# Patient Record
Sex: Female | Born: 1990 | Race: White | Hispanic: No | Smoking: Current some day smoker
Health system: Southern US, Community
[De-identification: ages and names within clinical notes are randomized; demographics above are authoritative.]

## PROBLEM LIST (undated history)

## (undated) DIAGNOSIS — R112 Nausea with vomiting, unspecified: Secondary | ICD-10-CM

## (undated) DIAGNOSIS — J189 Pneumonia, unspecified organism: Secondary | ICD-10-CM

## (undated) DIAGNOSIS — N83209 Unspecified ovarian cyst, unspecified side: Secondary | ICD-10-CM

## (undated) DIAGNOSIS — E559 Vitamin D deficiency, unspecified: Secondary | ICD-10-CM

## (undated) DIAGNOSIS — R51 Headache: Secondary | ICD-10-CM

## (undated) DIAGNOSIS — R519 Headache, unspecified: Secondary | ICD-10-CM

## (undated) DIAGNOSIS — R569 Unspecified convulsions: Secondary | ICD-10-CM

## (undated) DIAGNOSIS — K219 Gastro-esophageal reflux disease without esophagitis: Secondary | ICD-10-CM

## (undated) DIAGNOSIS — M2550 Pain in unspecified joint: Secondary | ICD-10-CM

## (undated) DIAGNOSIS — M549 Dorsalgia, unspecified: Secondary | ICD-10-CM

## (undated) DIAGNOSIS — Z8481 Family history of carrier of genetic disease: Secondary | ICD-10-CM

## (undated) DIAGNOSIS — Z8041 Family history of malignant neoplasm of ovary: Secondary | ICD-10-CM

## (undated) DIAGNOSIS — Z803 Family history of malignant neoplasm of breast: Secondary | ICD-10-CM

## (undated) DIAGNOSIS — C801 Malignant (primary) neoplasm, unspecified: Secondary | ICD-10-CM

## (undated) DIAGNOSIS — E66811 Obesity, class 1: Secondary | ICD-10-CM

## (undated) DIAGNOSIS — N879 Dysplasia of cervix uteri, unspecified: Secondary | ICD-10-CM

## (undated) DIAGNOSIS — R748 Abnormal levels of other serum enzymes: Secondary | ICD-10-CM

## (undated) DIAGNOSIS — S42009A Fracture of unspecified part of unspecified clavicle, initial encounter for closed fracture: Secondary | ICD-10-CM

## (undated) DIAGNOSIS — Z9889 Other specified postprocedural states: Secondary | ICD-10-CM

## (undated) DIAGNOSIS — E7800 Pure hypercholesterolemia, unspecified: Secondary | ICD-10-CM

## (undated) DIAGNOSIS — E669 Obesity, unspecified: Secondary | ICD-10-CM

## (undated) HISTORY — DX: Pure hypercholesterolemia, unspecified: E78.00

## (undated) HISTORY — DX: Vitamin D deficiency, unspecified: E55.9

## (undated) HISTORY — DX: Unspecified ovarian cyst, unspecified side: N83.209

## (undated) HISTORY — DX: Family history of malignant neoplasm of ovary: Z80.41

## (undated) HISTORY — PX: FRACTURE SURGERY: SHX138

## (undated) HISTORY — PX: OVARY SURGERY: SHX727

## (undated) HISTORY — DX: Pain in unspecified joint: M25.50

## (undated) HISTORY — DX: Dorsalgia, unspecified: M54.9

## (undated) HISTORY — DX: Dysplasia of cervix uteri, unspecified: N87.9

## (undated) HISTORY — PX: BUNIONECTOMY: SHX129

## (undated) HISTORY — DX: Family history of malignant neoplasm of breast: Z80.3

## (undated) HISTORY — PX: WISDOM TOOTH EXTRACTION: SHX21

## (undated) HISTORY — PX: DILATION AND CURETTAGE OF UTERUS: SHX78

## (undated) HISTORY — PX: LEEP: SHX91

## (undated) HISTORY — DX: Family history of carrier of genetic disease: Z84.81

## (undated) HISTORY — PX: HARDWARE REMOVAL: SHX979

---

## 2016-08-17 ENCOUNTER — Encounter (HOSPITAL_COMMUNITY): Payer: Self-pay | Admitting: Emergency Medicine

## 2016-08-17 ENCOUNTER — Emergency Department (HOSPITAL_COMMUNITY)
Admission: EM | Admit: 2016-08-17 | Discharge: 2016-08-17 | Disposition: A | Discharge status: Home / Self Care | Payer: BLUE CROSS/BLUE SHIELD | Attending: Emergency Medicine | Admitting: Emergency Medicine

## 2016-08-17 ENCOUNTER — Emergency Department (HOSPITAL_COMMUNITY): Payer: BLUE CROSS/BLUE SHIELD

## 2016-08-17 DIAGNOSIS — F17200 Nicotine dependence, unspecified, uncomplicated: Secondary | ICD-10-CM | POA: Diagnosis not present

## 2016-08-17 DIAGNOSIS — Y9389 Activity, other specified: Secondary | ICD-10-CM | POA: Diagnosis not present

## 2016-08-17 DIAGNOSIS — Y999 Unspecified external cause status: Secondary | ICD-10-CM | POA: Insufficient documentation

## 2016-08-17 DIAGNOSIS — W1839XA Other fall on same level, initial encounter: Secondary | ICD-10-CM | POA: Diagnosis not present

## 2016-08-17 DIAGNOSIS — S4992XA Unspecified injury of left shoulder and upper arm, initial encounter: Secondary | ICD-10-CM | POA: Diagnosis present

## 2016-08-17 DIAGNOSIS — S42022A Displaced fracture of shaft of left clavicle, initial encounter for closed fracture: Secondary | ICD-10-CM | POA: Insufficient documentation

## 2016-08-17 DIAGNOSIS — Y92331 Roller skating rink as the place of occurrence of the external cause: Secondary | ICD-10-CM | POA: Diagnosis not present

## 2016-08-17 LAB — POC URINE PREG, ED: Preg Test, Ur: NEGATIVE

## 2016-08-17 MED ORDER — ONDANSETRON HCL 4 MG PO TABS: 4.0000 mg | ORAL_TABLET | Freq: Three times a day (TID) | ORAL | 0 refills | Status: DC | PRN

## 2016-08-17 MED ORDER — OXYCODONE-ACETAMINOPHEN 5-325 MG PO TABS
2.0000 | ORAL_TABLET | Freq: Once | ORAL | Status: AC
  Administered 2016-08-17: 2 via ORAL
  Filled 2016-08-17: qty 2

## 2016-08-17 MED ORDER — OXYCODONE-ACETAMINOPHEN 5-325 MG PO TABS: 1.0000 | ORAL_TABLET | ORAL | 0 refills | Status: DC | PRN

## 2016-08-17 NOTE — ED Notes (Signed)
Patient transported to X-ray 

## 2016-08-17 NOTE — ED Notes (Signed)
ED Provider at bedside. 
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
## 2016-08-17 NOTE — ED Triage Notes (Signed)
C/o L clavicle/ shoulder pain since injuring it while she was at [place].

## 2016-08-19 ENCOUNTER — Ambulatory Visit (INDEPENDENT_AMBULATORY_CARE_PROVIDER_SITE_OTHER): Payer: BLUE CROSS/BLUE SHIELD | Admitting: Orthopaedic Surgery

## 2016-08-19 ENCOUNTER — Encounter (INDEPENDENT_AMBULATORY_CARE_PROVIDER_SITE_OTHER): Payer: Self-pay | Admitting: Orthopaedic Surgery

## 2016-08-19 ENCOUNTER — Other Ambulatory Visit (INDEPENDENT_AMBULATORY_CARE_PROVIDER_SITE_OTHER): Payer: Self-pay | Admitting: Orthopaedic Surgery

## 2016-08-19 VITALS — Ht 67.0 in | Wt 200.0 lb

## 2016-08-19 DIAGNOSIS — S42002A Fracture of unspecified part of left clavicle, initial encounter for closed fracture: Secondary | ICD-10-CM

## 2016-08-19 DIAGNOSIS — S42022A Displaced fracture of shaft of left clavicle, initial encounter for closed fracture: Secondary | ICD-10-CM | POA: Diagnosis not present

## 2016-08-19 MED ORDER — OXYCODONE-ACETAMINOPHEN 5-325 MG PO TABS: 1.0000 | ORAL_TABLET | ORAL | 0 refills | Status: DC | PRN

## 2016-08-19 MED ORDER — METHOCARBAMOL 500 MG PO TABS: 500.0000 mg | ORAL_TABLET | Freq: Four times a day (QID) | ORAL | 2 refills | Status: DC | PRN

## 2016-08-19 NOTE — Addendum Note (Signed)
Addended by: Azucena Cecil on: 08/19/2016 09:16 AM   Modules accepted: Orders
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
## 2016-08-20 ENCOUNTER — Encounter (HOSPITAL_COMMUNITY): Payer: Self-pay | Admitting: *Deleted

## 2016-08-20 NOTE — Progress Notes (Signed)
Pt denies SOB, chest pain, and being under the care of a cardiologist. Pt denies having a stress test, echo and cardiac cath. Pt denies having a chest x ray and EKG within the last year. Pt made aware to stop taking Aspirin, vitamins, fish oil and herbal medications. Do not take any NSAIDs ie: Ibuprofen, Advil, Naproxen, BC and Goody Powder or any medication containing Aspirin. FYI, pt stated that she drank a bottle of Mag Citrate today for constipation from pain medication. Pt verbalized understanding of all pre-op instructions.

## 2016-08-21 ENCOUNTER — Ambulatory Visit (HOSPITAL_COMMUNITY): Payer: BLUE CROSS/BLUE SHIELD | Admitting: Certified Registered"

## 2016-08-21 ENCOUNTER — Encounter (HOSPITAL_COMMUNITY)
Admission: RE | Disposition: A | Discharge status: Home / Self Care | Payer: Self-pay | Source: Ambulatory Visit | Attending: Orthopaedic Surgery

## 2016-08-21 ENCOUNTER — Ambulatory Visit (HOSPITAL_COMMUNITY)
Admission: RE | Admit: 2016-08-21 | Discharge: 2016-08-21 | Disposition: A | Discharge status: Home / Self Care | Payer: BLUE CROSS/BLUE SHIELD | Source: Ambulatory Visit | Attending: Orthopaedic Surgery | Admitting: Orthopaedic Surgery

## 2016-08-21 ENCOUNTER — Encounter (HOSPITAL_COMMUNITY): Payer: Self-pay | Admitting: Surgery

## 2016-08-21 ENCOUNTER — Ambulatory Visit (HOSPITAL_COMMUNITY): Payer: BLUE CROSS/BLUE SHIELD

## 2016-08-21 DIAGNOSIS — F17210 Nicotine dependence, cigarettes, uncomplicated: Secondary | ICD-10-CM | POA: Diagnosis not present

## 2016-08-21 DIAGNOSIS — K219 Gastro-esophageal reflux disease without esophagitis: Secondary | ICD-10-CM | POA: Diagnosis not present

## 2016-08-21 DIAGNOSIS — X58XXXA Exposure to other specified factors, initial encounter: Secondary | ICD-10-CM | POA: Diagnosis not present

## 2016-08-21 DIAGNOSIS — S42002A Fracture of unspecified part of left clavicle, initial encounter for closed fracture: Secondary | ICD-10-CM

## 2016-08-21 DIAGNOSIS — Z419 Encounter for procedure for purposes other than remedying health state, unspecified: Secondary | ICD-10-CM

## 2016-08-21 HISTORY — DX: Gastro-esophageal reflux disease without esophagitis: K21.9

## 2016-08-21 HISTORY — PX: ORIF CLAVICULAR FRACTURE: SHX5055

## 2016-08-21 HISTORY — DX: Unspecified convulsions: R56.9

## 2016-08-21 HISTORY — DX: Malignant (primary) neoplasm, unspecified: C80.1

## 2016-08-21 HISTORY — DX: Nausea with vomiting, unspecified: R11.2

## 2016-08-21 HISTORY — DX: Headache: R51

## 2016-08-21 HISTORY — DX: Headache, unspecified: R51.9

## 2016-08-21 HISTORY — DX: Fracture of unspecified part of unspecified clavicle, initial encounter for closed fracture: S42.009A

## 2016-08-21 HISTORY — DX: Other specified postprocedural states: Z98.890

## 2016-08-21 HISTORY — DX: Abnormal levels of other serum enzymes: R74.8

## 2016-08-21 LAB — CBC
HCT: 38.1 % (ref 36.0–46.0)
Hemoglobin: 12.9 g/dL (ref 12.0–15.0)
MCH: 31.9 pg (ref 26.0–34.0)
MCHC: 33.9 g/dL (ref 30.0–36.0)
MCV: 94.1 fL (ref 78.0–100.0)
PLATELETS: 274 10*3/uL (ref 150–400)
RBC: 4.05 MIL/uL (ref 3.87–5.11)
RDW: 12.8 % (ref 11.5–15.5)
WBC: 7.8 10*3/uL (ref 4.0–10.5)

## 2016-08-21 SURGERY — OPEN REDUCTION INTERNAL FIXATION (ORIF) CLAVICULAR FRACTURE
Anesthesia: General | Laterality: Left

## 2016-08-21 MED ORDER — MIDAZOLAM HCL 2 MG/2ML IJ SOLN
INTRAMUSCULAR | Status: AC
  Filled 2016-08-21: qty 2

## 2016-08-21 MED ORDER — LACTATED RINGERS IV SOLN: INTRAVENOUS | Status: DC

## 2016-08-21 MED ORDER — FENTANYL CITRATE (PF) 100 MCG/2ML IJ SOLN
INTRAMUSCULAR | Status: AC
  Filled 2016-08-21: qty 2

## 2016-08-21 MED ORDER — GLYCOPYRROLATE 0.2 MG/ML IJ SOLN
INTRAMUSCULAR | Status: DC | PRN
  Administered 2016-08-21: 0.6 mg via INTRAVENOUS

## 2016-08-21 MED ORDER — ONDANSETRON HCL 4 MG PO TABS: 4.0000 mg | ORAL_TABLET | Freq: Three times a day (TID) | ORAL | 0 refills | Status: DC | PRN

## 2016-08-21 MED ORDER — DEXAMETHASONE SODIUM PHOSPHATE 10 MG/ML IJ SOLN
INTRAMUSCULAR | Status: AC
  Filled 2016-08-21: qty 2

## 2016-08-21 MED ORDER — GLYCOPYRROLATE 0.2 MG/ML IV SOSY
PREFILLED_SYRINGE | INTRAVENOUS | Status: AC
  Filled 2016-08-21: qty 3

## 2016-08-21 MED ORDER — SENNOSIDES-DOCUSATE SODIUM 8.6-50 MG PO TABS: 1.0000 | ORAL_TABLET | Freq: Every evening | ORAL | 1 refills | Status: DC | PRN

## 2016-08-21 MED ORDER — PHENYLEPHRINE 40 MCG/ML (10ML) SYRINGE FOR IV PUSH (FOR BLOOD PRESSURE SUPPORT)
PREFILLED_SYRINGE | INTRAVENOUS | Status: AC
  Filled 2016-08-21: qty 20

## 2016-08-21 MED ORDER — OXYCODONE HCL 5 MG/5ML PO SOLN: 5.0000 mg | Freq: Once | ORAL | Status: AC | PRN

## 2016-08-21 MED ORDER — PROPOFOL 10 MG/ML IV BOLUS
INTRAVENOUS | Status: AC
  Filled 2016-08-21: qty 20

## 2016-08-21 MED ORDER — OXYCODONE HCL 5 MG PO TABS
5.0000 mg | ORAL_TABLET | Freq: Once | ORAL | Status: AC | PRN
  Administered 2016-08-21: 5 mg via ORAL

## 2016-08-21 MED ORDER — EPHEDRINE 5 MG/ML INJ
INTRAVENOUS | Status: AC
  Filled 2016-08-21: qty 10

## 2016-08-21 MED ORDER — 0.9 % SODIUM CHLORIDE (POUR BTL) OPTIME
TOPICAL | Status: DC | PRN
  Administered 2016-08-21: 3000 mL

## 2016-08-21 MED ORDER — ONDANSETRON HCL 4 MG/2ML IJ SOLN
INTRAMUSCULAR | Status: DC | PRN
  Administered 2016-08-21: 4 mg via INTRAVENOUS

## 2016-08-21 MED ORDER — ONDANSETRON HCL 4 MG/2ML IJ SOLN
INTRAMUSCULAR | Status: AC
  Filled 2016-08-21: qty 4

## 2016-08-21 MED ORDER — OXYCODONE HCL ER 10 MG PO T12A: 10.0000 mg | EXTENDED_RELEASE_TABLET | Freq: Two times a day (BID) | ORAL | 0 refills | Status: DC

## 2016-08-21 MED ORDER — ROCURONIUM BROMIDE 100 MG/10ML IV SOLN
INTRAVENOUS | Status: DC | PRN
  Administered 2016-08-21: 10 mg via INTRAVENOUS
  Administered 2016-08-21: 50 mg via INTRAVENOUS

## 2016-08-21 MED ORDER — LACTATED RINGERS IV SOLN
INTRAVENOUS | Status: DC | PRN
  Administered 2016-08-21 (×2): via INTRAVENOUS

## 2016-08-21 MED ORDER — PROMETHAZINE HCL 25 MG PO TABS: 25.0000 mg | ORAL_TABLET | Freq: Four times a day (QID) | ORAL | 1 refills | Status: DC | PRN

## 2016-08-21 MED ORDER — ONDANSETRON HCL 4 MG/2ML IJ SOLN: 4.0000 mg | Freq: Once | INTRAMUSCULAR | Status: DC | PRN

## 2016-08-21 MED ORDER — OXYCODONE HCL 5 MG PO TABS
ORAL_TABLET | ORAL | Status: AC
  Filled 2016-08-21: qty 1

## 2016-08-21 MED ORDER — MIDAZOLAM HCL 5 MG/5ML IJ SOLN
INTRAMUSCULAR | Status: DC | PRN
  Administered 2016-08-21: 2 mg via INTRAVENOUS

## 2016-08-21 MED ORDER — DEXAMETHASONE SODIUM PHOSPHATE 10 MG/ML IJ SOLN
INTRAMUSCULAR | Status: DC | PRN
  Administered 2016-08-21: 10 mg via INTRAVENOUS

## 2016-08-21 MED ORDER — METHOCARBAMOL 750 MG PO TABS: 750.0000 mg | ORAL_TABLET | Freq: Two times a day (BID) | ORAL | 0 refills | Status: DC | PRN

## 2016-08-21 MED ORDER — FENTANYL CITRATE (PF) 100 MCG/2ML IJ SOLN
25.0000 ug | INTRAMUSCULAR | Status: DC | PRN
  Administered 2016-08-21 (×2): 50 ug via INTRAVENOUS

## 2016-08-21 MED ORDER — BUPIVACAINE-EPINEPHRINE (PF) 0.25% -1:200000 IJ SOLN
INTRAMUSCULAR | Status: DC | PRN
  Administered 2016-08-21: 30 mL

## 2016-08-21 MED ORDER — LIDOCAINE 2% (20 MG/ML) 5 ML SYRINGE
INTRAMUSCULAR | Status: AC
  Filled 2016-08-21: qty 10

## 2016-08-21 MED ORDER — FENTANYL CITRATE (PF) 100 MCG/2ML IJ SOLN
INTRAMUSCULAR | Status: AC
Start: 2016-08-21 — End: 2016-08-21
  Filled 2016-08-21: qty 2

## 2016-08-21 MED ORDER — OXYCODONE-ACETAMINOPHEN 5-325 MG PO TABS: 1.0000 | ORAL_TABLET | ORAL | 0 refills | Status: DC | PRN

## 2016-08-21 MED ORDER — BUPIVACAINE-EPINEPHRINE (PF) 0.25% -1:200000 IJ SOLN
INTRAMUSCULAR | Status: AC
  Filled 2016-08-21: qty 30

## 2016-08-21 MED ORDER — CLINDAMYCIN PHOSPHATE 900 MG/50ML IV SOLN
900.0000 mg | INTRAVENOUS | Status: AC
  Administered 2016-08-21: 900 mg via INTRAVENOUS
  Filled 2016-08-21: qty 50

## 2016-08-21 MED ORDER — LIDOCAINE HCL (CARDIAC) 20 MG/ML IV SOLN
INTRAVENOUS | Status: DC | PRN
  Administered 2016-08-21: 50 mg via INTRAVENOUS

## 2016-08-21 MED ORDER — NEOSTIGMINE METHYLSULFATE 10 MG/10ML IV SOLN
INTRAVENOUS | Status: DC | PRN
  Administered 2016-08-21: 4 mg via INTRAVENOUS

## 2016-08-21 MED ORDER — FENTANYL CITRATE (PF) 100 MCG/2ML IJ SOLN
INTRAMUSCULAR | Status: DC | PRN
  Administered 2016-08-21 (×2): 50 ug via INTRAVENOUS
  Administered 2016-08-21: 100 ug via INTRAVENOUS

## 2016-08-21 MED ORDER — NEOSTIGMINE METHYLSULFATE 5 MG/5ML IV SOSY
PREFILLED_SYRINGE | INTRAVENOUS | Status: AC
  Filled 2016-08-21: qty 5

## 2016-08-21 MED ORDER — PROPOFOL 10 MG/ML IV BOLUS
INTRAVENOUS | Status: DC | PRN
  Administered 2016-08-21: 180 mg via INTRAVENOUS

## 2016-08-21 SURGICAL SUPPLY — 51 items
BIT DRILL 2.6 (BIT) ×3 IMPLANT
CLOSURE STERI-STRIP 1/4X4 (GAUZE/BANDAGES/DRESSINGS) ×3 IMPLANT
CLOSURE WOUND 1/2 X4 (GAUZE/BANDAGES/DRESSINGS)
COUNTERSINK (MISCELLANEOUS) ×3
COVER SURGICAL LIGHT HANDLE (MISCELLANEOUS) ×3 IMPLANT
DRAPE C-ARM 42X72 X-RAY (DRAPES) ×3 IMPLANT
DRAPE IMP U-DRAPE 54X76 (DRAPES) ×3 IMPLANT
DRAPE INCISE IOBAN 66X45 STRL (DRAPES) ×3 IMPLANT
DRAPE SURG 17X23 STRL (DRAPES) ×3 IMPLANT
DRAPE U-SHAPE 47X51 STRL (DRAPES) ×3 IMPLANT
DRILL 2.6X220MM LONG AO (BIT) ×3 IMPLANT
DRSG TEGADERM 4X4.75 (GAUZE/BANDAGES/DRESSINGS) ×6 IMPLANT
DURAPREP 26ML APPLICATOR (WOUND CARE) ×6 IMPLANT
ELECT BLADE 4.0 EZ CLEAN MEGAD (MISCELLANEOUS) ×3
ELECT CAUTERY BLADE 6.4 (BLADE) ×3 IMPLANT
ELECT REM PT RETURN 9FT ADLT (ELECTROSURGICAL) ×3
ELECTRODE BLDE 4.0 EZ CLN MEGD (MISCELLANEOUS) ×1 IMPLANT
ELECTRODE REM PT RTRN 9FT ADLT (ELECTROSURGICAL) ×1 IMPLANT
FACESHIELD WRAPAROUND (MASK) ×3 IMPLANT
GAUZE XEROFORM 1X8 LF (GAUZE/BANDAGES/DRESSINGS) ×3 IMPLANT
GLOVE SKINSENSE NS SZ7.5 (GLOVE) ×4
GLOVE SKINSENSE STRL SZ7.5 (GLOVE) ×2 IMPLANT
GOWN STRL REIN XL XLG (GOWN DISPOSABLE) ×6 IMPLANT
KIT BASIN OR (CUSTOM PROCEDURE TRAY) ×3 IMPLANT
KIT ROOM TURNOVER OR (KITS) ×3 IMPLANT
MANIFOLD NEPTUNE II (INSTRUMENTS) ×3 IMPLANT
NEEDLE HYPO 25GX1X1/2 BEV (NEEDLE) ×3 IMPLANT
NS IRRIG 1000ML POUR BTL (IV SOLUTION) ×9 IMPLANT
PACK SHOULDER (CUSTOM PROCEDURE TRAY) ×3 IMPLANT
PAD ARMBOARD 7.5X6 YLW CONV (MISCELLANEOUS) ×6 IMPLANT
PLATE MIDSHAFT 7H ANTERIOR (Plate) ×3 IMPLANT
SCREW BONE 3.5X12 (Screw) ×3 IMPLANT
SCREW BONE 3.5X14MM (Screw) ×6 IMPLANT
SCREW BONE 3.5X16MM (Screw) ×3 IMPLANT
SCREW BONE 3.5X20MM (Screw) ×3 IMPLANT
SCREW CORTICAL 2.7X14MM (Screw) ×3 IMPLANT
SCREW COUNTERSINK (MISCELLANEOUS) ×1 IMPLANT
SCREW NON LOCKING 22MM (Screw) ×3 IMPLANT
SPONGE GAUZE 4X4 12PLY STER LF (GAUZE/BANDAGES/DRESSINGS) ×3 IMPLANT
SPONGE LAP 18X18 X RAY DECT (DISPOSABLE) ×6 IMPLANT
STRIP CLOSURE SKIN 1/2X4 (GAUZE/BANDAGES/DRESSINGS) IMPLANT
SUCTION FRAZIER HANDLE 10FR (MISCELLANEOUS) ×2
SUCTION TUBE FRAZIER 10FR DISP (MISCELLANEOUS) ×1 IMPLANT
SUT MNCRL AB 4-0 PS2 18 (SUTURE) ×3 IMPLANT
SUT VIC AB 0 CT1 27 (SUTURE) ×2
SUT VIC AB 0 CT1 27XBRD ANBCTR (SUTURE) ×1 IMPLANT
SUT VIC AB 2-0 CT1 27 (SUTURE) ×4
SUT VIC AB 2-0 CT1 TAPERPNT 27 (SUTURE) ×2 IMPLANT
SUT VICRYL 0 CT 1 36IN (SUTURE) ×3 IMPLANT
SYR CONTROL 10ML LL (SYRINGE) ×3 IMPLANT
WATER STERILE IRR 1000ML POUR (IV SOLUTION) ×3 IMPLANT

## 2016-08-21 NOTE — Anesthesia Procedure Notes (Signed)
Procedure Name: Intubation Date/Time: 08/21/2016 1:31 PM Performed by: Gaylene Brooks Pre-anesthesia Checklist: Patient identified, Emergency Drugs available, Suction available and Patient being monitored Patient Re-evaluated:Patient Re-evaluated prior to inductionOxygen Delivery Method: Circle System Utilized Preoxygenation: Pre-oxygenation with 100% oxygen Intubation Type: IV induction Ventilation: Mask ventilation without difficulty Laryngoscope Size: Miller and 2 Grade View: Grade I Tube type: Oral Tube size: 7.0 mm Number of attempts: 1 Airway Equipment and Method: Stylet and Oral airway Placement Confirmation: ETT inserted through vocal cords under direct vision,  positive ETCO2 and breath sounds checked- equal and bilateral Secured at: 21 cm Tube secured with: Tape Dental Injury: Teeth and Oropharynx as per pre-operative assessment

## 2016-08-21 NOTE — H&P (Signed)
PREOPERATIVE H&P  Chief Complaint: left clavicle fracture  HPI: Rebecca Adkins is a 25 y.o. female who presents for surgical treatment of left clavicle fracture.  She denies any changes in medical history.  Past Medical History:  Diagnosis Date  . Cancer (HCC)    cervical  . Clavicle fracture    left  . Elevated liver enzymes   . GERD (gastroesophageal reflux disease)   . Headache   . PONV (postoperative nausea and vomiting)   . Seizures ([place])    as a child only   Past Surgical History:  Procedure Laterality Date  . DILATION AND CURETTAGE OF UTERUS    . OVARY SURGERY     Social History   Social History  . Marital status: Single    Spouse name: N/A  . Number of children: N/A  . Years of education: N/A   Social History Main Topics  . Smoking status: Current Every Day Smoker    Packs/day: 1.00    Types: Cigarettes  . Smokeless tobacco: Never Used  . Alcohol use Yes     Comment: ocassional  . Drug use: No  . Sexual activity: Not Asked   Other Topics Concern  . None   Social History Narrative  . None   Family History  Problem Relation Age of Onset  . Hypertension Mother   . Diabetes Other   . Hypertension Other   . COPD Other   . Other Other    Allergies  Allergen Reactions  . Amoxicillin Hives     " Hives and rash "  . Nickel Hives    " hives and a rash"  . Penicillins     Has patient had a PCN reaction causing immediate rash, facial/tongue/throat swelling, SOB or lightheadedness with hypotension: Yes Has patient had a PCN reaction causing severe rash involving mucus membranes or skin necrosis: No Has patient had a PCN reaction that required hospitalization No Has patient had a PCN reaction occurring within the last 10 years: No If all of the above answers are "NO", then may proceed with Cephalosporin use.    Prior to Admission medications   Medication Sig Start Date End Date Taking? Authorizing Provider  Docusate Calcium (STOOL SOFTENER PO)  Take 1 tablet by mouth daily as needed (constipation).   Yes Historical Provider, MD  ibuprofen (ADVIL,MOTRIN) 200 MG tablet Take 800 mg by mouth every 6 (six) hours as needed for mild pain.   Yes Historical Provider, MD  Multiple Vitamin (MULTIVITAMIN WITH MINERALS) TABS tablet Take 1 tablet by mouth daily.   Yes Historical Provider, MD  ondansetron (ZOFRAN) 4 MG tablet Take 1 tablet (4 mg total) by mouth every 8 (eight) hours as needed for nausea or vomiting. 08/17/16  Yes Duffy Bruce, MD  oxyCODONE-acetaminophen (PERCOCET/ROXICET) 5-325 MG tablet Take 1-2 tablets by mouth every 4 (four) hours as needed for severe pain. 08/17/16  Yes Duffy Bruce, MD  SPRINTEC 28 0.25-35 MG-MCG tablet Take 1 tablet by mouth daily.  06/09/16  Yes Historical Provider, MD  methocarbamol (ROBAXIN) 500 MG tablet Take 1 tablet (500 mg total) by mouth every 6 (six) hours as needed for muscle spasms. 08/19/16   Naiping Ephriam Jenkins, MD  oxyCODONE-acetaminophen (PERCOCET) 5-325 MG tablet Take 1 tablet by mouth every 4 (four) hours as needed for severe pain. 08/19/16   Naiping Ephriam Jenkins, MD     Positive ROS: All other systems have been reviewed and were otherwise negative with the exception of those  mentioned in the HPI and as above.  Physical Exam: General: Alert, no acute distress Cardiovascular: No pedal edema Respiratory: No cyanosis, no use of accessory musculature GI: abdomen soft Skin: No lesions in the area of chief complaint Neurologic: Sensation intact distally Psychiatric: Patient is competent for consent with normal mood and affect Lymphatic: no lymphedema  MUSCULOSKELETAL: exam stable  Assessment: left clavicle fracture  Plan: Plan for Procedure(s): OPEN REDUCTION INTERNAL FIXATION (ORIF) LEFT CLAVICLE FRACTURE  The risks benefits and alternatives were discussed with the patient including but not limited to the risks of nonoperative treatment, versus surgical intervention including infection, bleeding,  nerve injury,  blood clots, cardiopulmonary complications, morbidity, mortality, among others, and they were willing to proceed.   Eduard Roux, MD   08/21/2016 10:05 AM

## 2016-08-21 NOTE — Discharge Instructions (Signed)
° °  Postoperative instructions:  Weightbearing: non weight bearing  Keep your dressing and/or splint clean and dry at all times.  You can remove your dressing on post-operative day #3 and change with a dry/sterile dressing or Band-Aids as needed thereafter.    Incision instructions:  Do not soak your incision for 3 weeks after surgery.  If the incision gets wet, pat dry and do not scrub the incision.  Pain control:  You have been given a prescription to be taken as directed for post-operative pain control.  In addition, elevate the operative extremity above the heart at all times to prevent swelling and throbbing pain.  Take over-the-counter Colace, 100mg  by mouth twice a day while taking narcotic pain medications to help prevent constipation.  Follow up appointments: 1) 10-14 days for suture removal and wound check. 2) Rebecca Adkins as scheduled.   -------------------------------------------------------------------------------------------------------------  After Surgery Pain Control:  After your surgery, post-surgical discomfort or pain is likely. This discomfort can last several days to a few weeks. At certain times of the day your discomfort may be more intense.  Did you receive a nerve block?  A nerve block can provide pain relief for one hour to two days after your surgery. As long as the nerve block is working, you will experience little or no sensation in the area the surgeon operated on.  As the nerve block wears off, you will begin to experience pain or discomfort. It is very important that you begin taking your prescribed pain medication before the nerve block fully wears off. Treating your pain at the first sign of the block wearing off will ensure your pain is better controlled and more tolerable when full-sensation returns. Do not wait until the pain is intolerable, as the medicine will be less effective. It is better to treat pain in advance than to try and catch up.  General  Anesthesia:  If you did not receive a nerve block during your surgery, you will need to start taking your pain medication shortly after your surgery and should continue to do so as prescribed by your surgeon.  Pain Medication:  Most commonly we prescribe Vicodin and Percocet for post-operative pain. Both of these medications contain a combination of acetaminophen (Tylenol) and a narcotic to help control pain.   It takes between 30 and 45 minutes before pain medication starts to work. It is important to take your medication before your pain level gets too intense.   Nausea is a common side effect of many pain medications. You will want to eat something before taking your pain medicine to help prevent nausea.   If you are taking a prescription pain medication that contains acetaminophen, we recommend that you do not take additional over the counter acetaminophen (Tylenol).  Other pain relieving options:   Using a cold pack to ice the affected area a few times a day (15 to 20 minutes at a time) can help to relieve pain, reduce swelling and bruising.   Elevation of the affected area can also help to reduce pain and swelling.

## 2016-08-21 NOTE — Transfer of Care (Signed)
Immediate Anesthesia Transfer of Care Note  Patient: Rebecca Adkins  Procedure(s) Performed: Procedure(s): OPEN REDUCTION INTERNAL FIXATION (ORIF) LEFT CLAVICLE FRACTURE (Left)  Patient Location: PACU  Anesthesia Type:General  Level of Consciousness: awake, oriented and patient cooperative  Airway & Oxygen Therapy: Patient Spontanous Breathing and Patient connected to nasal cannula oxygen  Post-op Assessment: Report given to RN, Post -op Vital signs reviewed and stable and Patient moving all extremities  Post vital signs: Reviewed and stable  Last Vitals:  Vitals:   08/21/16 1616 08/21/16 1651  BP: 120/81 115/84  Pulse: 70 66  Resp: 20 18  Temp:      Last Pain:  Vitals:   08/21/16 1623  TempSrc:   PainSc: 5       Patients Stated Pain Goal: 4 ([id number])  Complications: No apparent anesthesia complications

## 2016-08-21 NOTE — Anesthesia Postprocedure Evaluation (Signed)
Anesthesia Post Note  Patient: Rebecca Adkins  Procedure(s) Performed: Procedure(s) (LRB): OPEN REDUCTION INTERNAL FIXATION (ORIF) LEFT CLAVICLE FRACTURE (Left)  Patient location during evaluation: PACU Anesthesia Type: General Level of consciousness: awake and alert Pain management: pain level controlled Vital Signs Assessment: post-procedure vital signs reviewed and stable Respiratory status: spontaneous breathing, nonlabored ventilation and respiratory function stable Cardiovascular status: blood pressure returned to baseline and stable Postop Assessment: no signs of nausea or vomiting Anesthetic complications: no    Last Vitals:  Vitals:   08/21/16 1616 08/21/16 1651  BP: 120/81 115/84  Pulse: 70 66  Resp: 20 18  Temp:      Last Pain:  Vitals:   08/21/16 1623  TempSrc:   PainSc: 5                  Kerrington Greenhalgh,W. EDMOND

## 2016-08-21 NOTE — Progress Notes (Signed)
Orthopedic Tech Progress Note Patient Details:  Rebecca Adkins Sep 21, 1991 [id number]  Ortho Devices Type of Ortho Device: Arm sling Ortho Device/Splint Location: LUE Ortho Device/Splint Interventions: Ordered, Application   Braulio Bosch 08/21/2016, 5:02 PM

## 2016-08-21 NOTE — Op Note (Signed)
   Date of Surgery: 08/21/2016  INDICATIONS: Ms. Kindig is a 25 y.o.-year-old female who sustained a left clavicle fracture;  The patient did consent to the procedure after discussion of the risks and benefits.  PREOPERATIVE DIAGNOSIS: Left clavicle fracture  POSTOPERATIVE DIAGNOSIS: Same.  PROCEDURE: Open treatment of left clavicle fracture with internal fixation  SURGEON: N. Eduard Roux, M.D.  ASSIST: Laure Kidney, RNFA.  ANESTHESIA:  general  IV FLUIDS AND URINE: See anesthesia.  ESTIMATED BLOOD LOSS: minimal mL.  IMPLANTS: [employer] Variax anterior plate  COMPLICATIONS: None.  DESCRIPTION OF PROCEDURE: The patient was brought to the operating room and placed supine on the operating table.  The patient had been signed prior to the procedure and this was documented. The patient had the anesthesia placed by the anesthesiologist.  The patient was then placed in the beach chair position.  All bony prominences were well padded.  A time-out was performed to confirm that this was the correct patient, site, side and location. The patient had an SCD on the lower extremities. The patient did receive antibiotics prior to the incision and was re-dosed during the procedure as needed at indicated intervals.  The patient had the operative extremity prepped and draped in the standard surgical fashion.    A horizontal incision based over the clavicle was used.  Cutaneous nerves were identified and protected as much as possible.  Full thickness flaps were elevated off of the clavicle.  The fracture was exposed.  Any organized hematoma and entrapped periosteum was retrieved from the fracture site. Reduction was obtained using clamps and a superior plate was applied to the clavicle.  The appropriate length was found by using fluoroscopy.  With the plate in the appropriate position and the fracture reduced.  Nonlocking screws were placed through the plate and into the clavicle.  Care was taken not to plunge  with any of the instruments.  Retractors were used as added protection to the neurovascular and pulmonary structures.  Final fluoroscopy pictures were taken to confirm plate placement and fracture reduction.  The wound was thoroughly irrigated and closed in a layer fashion using 0 vicryl, 2.0 vicryl and 3.0 monocryl.  Sterile dressings were applied and the patient was extubated and transferred to the PACU in stable condition.  POSTOPERATIVE PLAN: Patient will be in a sling for comfort.  He is allowed to range his shoulder up to the level of the shoulder and not allowed to lift anything.  Observation overnight for pain control and discharge home in the morning.  Azucena Cecil, MD [place] 3:04 PM

## 2016-08-21 NOTE — Anesthesia Preprocedure Evaluation (Signed)
Anesthesia Evaluation  Patient identified by MRN, date of birth, ID band Patient awake    Reviewed: Allergy & Precautions, NPO status , Patient's Chart, lab work & pertinent test results  Airway Mallampati: II  TM Distance: >3 FB Neck ROM: Full    Dental  (+) Teeth Intact, Dental Advisory Given   Pulmonary Current Smoker,  breath sounds clear to auscultation        Cardiovascular Rhythm:Regular Rate:Normal     Neuro/Psych    GI/Hepatic   Endo/Other    Renal/GU      Musculoskeletal   Abdominal   Peds  Hematology   Anesthesia Other Findings   Reproductive/Obstetrics                             Anesthesia Physical Anesthesia Plan  ASA: II  Anesthesia Plan: General   Post-op Pain Management:    Induction: Intravenous  Airway Management Planned: Oral ETT  Additional Equipment:   Intra-op Plan:   Post-operative Plan: Extubation in OR  Informed Consent: I have reviewed the patients History and Physical, chart, labs and discussed the procedure including the risks, benefits and alternatives for the proposed anesthesia with the patient or authorized representative who has indicated his/her understanding and acceptance.   Dental advisory given  Plan Discussed with: CRNA and Anesthesiologist  Anesthesia Plan Comments:         Anesthesia Quick Evaluation  

## 2016-08-22 ENCOUNTER — Encounter (HOSPITAL_COMMUNITY): Payer: Self-pay | Admitting: Orthopaedic Surgery

## 2016-08-29 ENCOUNTER — Telehealth (INDEPENDENT_AMBULATORY_CARE_PROVIDER_SITE_OTHER): Payer: Self-pay | Admitting: Orthopaedic Surgery

## 2016-08-29 NOTE — Telephone Encounter (Signed)
Called pt Rebecca Adkins to return my call

## 2016-09-02 ENCOUNTER — Encounter (INDEPENDENT_AMBULATORY_CARE_PROVIDER_SITE_OTHER): Payer: Self-pay | Admitting: Orthopaedic Surgery

## 2016-09-02 ENCOUNTER — Ambulatory Visit (INDEPENDENT_AMBULATORY_CARE_PROVIDER_SITE_OTHER): Payer: BLUE CROSS/BLUE SHIELD | Admitting: Orthopaedic Surgery

## 2016-09-02 ENCOUNTER — Ambulatory Visit (INDEPENDENT_AMBULATORY_CARE_PROVIDER_SITE_OTHER): Payer: Self-pay

## 2016-09-02 DIAGNOSIS — S42002D Fracture of unspecified part of left clavicle, subsequent encounter for fracture with routine healing: Secondary | ICD-10-CM | POA: Diagnosis not present

## 2016-09-02 MED ORDER — OXYCODONE-ACETAMINOPHEN 5-325 MG PO TABS: 1.0000 | ORAL_TABLET | Freq: Two times a day (BID) | ORAL | 0 refills | Status: DC | PRN

## 2016-09-02 NOTE — Progress Notes (Signed)
Patient is 2 weeks status post ORIF left clavicle fracture. She is having moderate pain is worse at night. Physical exam shows well-healed incision. No signs of infection. X-rays show stable fixation of the clavicle fracture. At this point I encouraged the patient to begin gentle range of motion and active assisted range of motion as tolerated to the level of the shoulder. I do want her to begin physical therapy at this point and strengthening at 6 weeks. I will see her back in 4 weeks with repeat 2 view x-rays of the left clavicle.

## 2016-09-03 ENCOUNTER — Telehealth (INDEPENDENT_AMBULATORY_CARE_PROVIDER_SITE_OTHER): Payer: Self-pay

## 2016-09-03 NOTE — Telephone Encounter (Signed)
yes

## 2016-09-03 NOTE — Telephone Encounter (Signed)
Abishai called and states she need for [place] to update her paperwork. They would like for [place] to fax the office notes/ work note. She states you told her she would be out of work for 4-6 weeks. She needs a work note. Would you like for me to make a note stating out of work until next office visit 09/30/16? Please advise  Fax number [phone] Claim number: [id number]

## 2016-09-04 NOTE — Telephone Encounter (Signed)
FAXED THIS MORNING

## 2016-09-18 ENCOUNTER — Telehealth (INDEPENDENT_AMBULATORY_CARE_PROVIDER_SITE_OTHER): Payer: Self-pay | Admitting: Orthopaedic Surgery

## 2016-09-19 NOTE — Telephone Encounter (Signed)
Approve.  Eval and treat ORIF clavicle.  ROM, strengthening

## 2016-09-19 NOTE — Telephone Encounter (Signed)
Please advise 

## 2016-09-23 NOTE — Telephone Encounter (Signed)
Faxed to provided fax #

## 2016-09-29 ENCOUNTER — Ambulatory Visit (INDEPENDENT_AMBULATORY_CARE_PROVIDER_SITE_OTHER): Payer: Self-pay

## 2016-09-29 ENCOUNTER — Encounter (INDEPENDENT_AMBULATORY_CARE_PROVIDER_SITE_OTHER): Payer: Self-pay | Admitting: Orthopaedic Surgery

## 2016-09-29 ENCOUNTER — Ambulatory Visit (INDEPENDENT_AMBULATORY_CARE_PROVIDER_SITE_OTHER): Payer: BLUE CROSS/BLUE SHIELD | Admitting: Orthopaedic Surgery

## 2016-09-29 DIAGNOSIS — S42002D Fracture of unspecified part of left clavicle, subsequent encounter for fracture with routine healing: Secondary | ICD-10-CM | POA: Diagnosis not present

## 2016-09-29 MED ORDER — HYDROCODONE-ACETAMINOPHEN 5-325 MG PO TABS: 1.0000 | ORAL_TABLET | Freq: Every evening | ORAL | 0 refills | Status: DC | PRN

## 2016-09-29 NOTE — Progress Notes (Signed)
Rebecca Adkins is 6 weeks status post ORIF left clavicle fracture. She does have some soreness and mainly pain at night. It is slightly tender to touch on the medial aspect of the clavicle. Physical exam shows a well-healed scar. No signs of infection. She is slightly tender over the medial aspect of the plate. X-rays show overall stable fixation and alignment. She does have persistent fracture line. I do not see any signs of hardware complications or loosening. Continue with physical therapy advanced to strengthening and weightbearing as tolerated all see her back in 6 weeks with repeat 2 view x-rays of the left clavicle. Work note was provided today

## 2016-10-07 ENCOUNTER — Telehealth (INDEPENDENT_AMBULATORY_CARE_PROVIDER_SITE_OTHER): Payer: Self-pay | Admitting: Orthopaedic Surgery

## 2016-10-07 NOTE — Telephone Encounter (Signed)
Do you know anything about this? 

## 2016-10-13 ENCOUNTER — Telehealth (INDEPENDENT_AMBULATORY_CARE_PROVIDER_SITE_OTHER): Payer: Self-pay | Admitting: Orthopaedic Surgery

## 2016-10-13 NOTE — Telephone Encounter (Signed)
Pt requested a call back for status of paperwork, see previous message regarding this   [phone]

## 2016-10-13 NOTE — Telephone Encounter (Signed)
Do you know anything about this? 

## 2016-10-20 ENCOUNTER — Emergency Department (HOSPITAL_BASED_OUTPATIENT_CLINIC_OR_DEPARTMENT_OTHER)
Admission: EM | Admit: 2016-10-20 | Discharge: 2016-10-21 | Disposition: A | Discharge status: Home / Self Care | Payer: BLUE CROSS/BLUE SHIELD | Attending: Emergency Medicine | Admitting: Emergency Medicine

## 2016-10-20 ENCOUNTER — Encounter (HOSPITAL_BASED_OUTPATIENT_CLINIC_OR_DEPARTMENT_OTHER): Payer: Self-pay | Admitting: Emergency Medicine

## 2016-10-20 DIAGNOSIS — L500 Allergic urticaria: Secondary | ICD-10-CM | POA: Diagnosis not present

## 2016-10-20 DIAGNOSIS — Z79899 Other long term (current) drug therapy: Secondary | ICD-10-CM | POA: Insufficient documentation

## 2016-10-20 DIAGNOSIS — T7840XA Allergy, unspecified, initial encounter: Secondary | ICD-10-CM | POA: Diagnosis present

## 2016-10-20 DIAGNOSIS — F17210 Nicotine dependence, cigarettes, uncomplicated: Secondary | ICD-10-CM | POA: Diagnosis not present

## 2016-10-20 DIAGNOSIS — L509 Urticaria, unspecified: Secondary | ICD-10-CM

## 2016-10-20 MED ORDER — PREDNISONE 50 MG PO TABS
60.0000 mg | ORAL_TABLET | Freq: Once | ORAL | Status: AC
  Administered 2016-10-20: 60 mg via ORAL
  Filled 2016-10-20: qty 1

## 2016-10-20 MED ORDER — DIPHENHYDRAMINE HCL 25 MG PO CAPS
25.0000 mg | ORAL_CAPSULE | Freq: Once | ORAL | Status: AC
  Administered 2016-10-20: 25 mg via ORAL
  Filled 2016-10-20: qty 1

## 2016-10-20 MED ORDER — PREDNISONE 50 MG PO TABS: ORAL_TABLET | ORAL | 0 refills | Status: DC

## 2016-10-20 MED ORDER — DIPHENHYDRAMINE HCL 25 MG PO CAPS
25.0000 mg | ORAL_CAPSULE | Freq: Once | ORAL | Status: DC
  Filled 2016-10-20: qty 1

## 2016-10-20 MED ORDER — FAMOTIDINE 20 MG PO TABS: 20.0000 mg | ORAL_TABLET | Freq: Two times a day (BID) | ORAL | 0 refills | Status: DC

## 2016-10-20 MED ORDER — DIPHENHYDRAMINE HCL 25 MG PO TABS: 25.0000 mg | ORAL_TABLET | Freq: Four times a day (QID) | ORAL | 0 refills | Status: DC

## 2016-10-20 NOTE — ED Provider Notes (Signed)
[facility] Provider Note   CSN: [id number] Arrival date & time: 10/20/16  1932   By signing my name below, I, Neta Mends, attest that this documentation has been prepared under the direction and in the presence of Ezequiel Essex, MD . Electronically Signed: Neta Mends, ED Scribe. 10/20/2016. 11:13 PM.   History   Chief Complaint Chief Complaint  Patient presents with  . Allergic Reaction    The history is provided by the patient. No language interpreter was used.   HPI Comments:  Rebecca Adkins is a 26 y.o. female who presents to the Emergency Department complaining of a possible allergic reaction that occurred at 0300 this morning. Pt states that she has a fluctuating, worsening rash on her chest and arms. Pt states that she was completely fine before bed. Pt states that she is itchy all over but is not in pain. Pt was referred to the ED by urgent care. Pt complains of associated chills, itchy/puffy eyes, wheezing, and cough. Pt states that has had hives previously but none of those episodes lasted this long. Pt took benadryl with mild temporary relief, and with her last dose 4 hours ago. Pt reports a known allergy to penicillins, tramadol. Pt denies using any new sheets, clothing, detergents. Pt denies other associated symptoms.   Past Medical History:  Diagnosis Date  . Cancer (HCC)    cervical  . Clavicle fracture    left  . Elevated liver enzymes   . GERD (gastroesophageal reflux disease)   . Headache   . PONV (postoperative nausea and vomiting)   . Seizures ([place])    as a child only    Patient Active Problem List   Diagnosis Date Noted  . Closed displaced fracture of left clavicle 08/19/2016    Past Surgical History:  Procedure Laterality Date  . DILATION AND CURETTAGE OF UTERUS    . ORIF CLAVICULAR FRACTURE Left 08/21/2016   Procedure: OPEN REDUCTION INTERNAL FIXATION (ORIF) LEFT CLAVICLE FRACTURE;  Surgeon: Leandrew Koyanagi, MD;   Location: [facility];  Service: Orthopedics;  Laterality: Left;  . OVARY SURGERY      OB History    No data available       Home Medications    Prior to Admission medications   Medication Sig Start Date End Date Taking? Authorizing Provider  Docusate Calcium (STOOL SOFTENER PO) Take 1 tablet by mouth daily as needed (constipation).    Historical Provider, MD  HYDROcodone-acetaminophen (NORCO) 5-325 MG tablet Take 1 tablet by mouth at bedtime as needed. 09/29/16   Naiping Ephriam Jenkins, MD  ibuprofen (ADVIL,MOTRIN) 200 MG tablet Take 800 mg by mouth every 6 (six) hours as needed for mild pain.    Historical Provider, MD  methocarbamol (ROBAXIN) 500 MG tablet Take 1 tablet (500 mg total) by mouth every 6 (six) hours as needed for muscle spasms. Patient not taking: Reported on 09/29/2016 08/19/16   Leandrew Koyanagi, MD  methocarbamol (ROBAXIN) 750 MG tablet Take 1 tablet (750 mg total) by mouth 2 (two) times daily as needed for muscle spasms. 08/21/16   Leandrew Koyanagi, MD  Multiple Vitamin (MULTIVITAMIN WITH MINERALS) TABS tablet Take 1 tablet by mouth daily.    Historical Provider, MD  ondansetron (ZOFRAN) 4 MG tablet Take 1 tablet (4 mg total) by mouth every 8 (eight) hours as needed for nausea or vomiting. Patient not taking: Reported on 09/29/2016 08/17/16   Duffy Bruce, MD  ondansetron (ZOFRAN) 4 MG tablet Take  1-2 tablets (4-8 mg total) by mouth every 8 (eight) hours as needed for nausea or vomiting. Patient not taking: Reported on 09/29/2016 08/21/16   Leandrew Koyanagi, MD  oxyCODONE (OXYCONTIN) 10 mg 12 hr tablet Take 1 tablet (10 mg total) by mouth every 12 (twelve) hours. Patient not taking: Reported on 09/29/2016 08/21/16   Leandrew Koyanagi, MD  oxyCODONE-acetaminophen (PERCOCET) 5-325 MG tablet Take 1-2 tablets by mouth every 4 (four) hours as needed for severe pain. Patient not taking: Reported on 09/29/2016 08/21/16   Leandrew Koyanagi, MD  oxyCODONE-acetaminophen (PERCOCET) 5-325 MG tablet Take 1 tablet by mouth 2  (two) times daily as needed for severe pain. Patient not taking: Reported on 09/29/2016 09/02/16   Leandrew Koyanagi, MD  oxyCODONE-acetaminophen (PERCOCET/ROXICET) 5-325 MG tablet Take 1-2 tablets by mouth every 4 (four) hours as needed for severe pain. Patient not taking: Reported on 09/29/2016 08/17/16   Duffy Bruce, MD  promethazine (PHENERGAN) 25 MG tablet Take 1 tablet (25 mg total) by mouth every 6 (six) hours as needed for nausea. Patient not taking: Reported on 09/29/2016 08/21/16   Leandrew Koyanagi, MD  senna-docusate (SENOKOT S) 8.6-50 MG tablet Take 1 tablet by mouth at bedtime as needed. Patient not taking: Reported on 09/29/2016 08/21/16   Leandrew Koyanagi, MD  SPRINTEC 28 0.25-35 MG-MCG tablet Take 1 tablet by mouth daily.  06/09/16   Historical Provider, MD    Family History Family History  Problem Relation Age of Onset  . Hypertension Mother   . Diabetes Other   . Hypertension Other   . COPD Other   . Other Other     Social History Social History  Substance Use Topics  . Smoking status: Current Every Day Smoker    Packs/day: 1.00    Types: Cigarettes    Last attempt to quit: 08/26/2016  . Smokeless tobacco: Never Used     Comment: PT STATES SHE QUIT SMOKING 1 WEEK AGO  . Alcohol use Yes     Comment: ocassional     Allergies   Amoxicillin; Nickel; Penicillins; and Tramadol   Review of Systems Review of Systems  Constitutional: Positive for chills.  Eyes: Positive for itching.  Respiratory: Positive for cough and wheezing.   Skin: Positive for rash.     Physical Exam Updated Vital Signs BP 151/93 (BP Location: Left Arm)   Pulse 92   Temp 98.1 F (36.7 C) (Oral)   Resp 20   LMP 10/13/2016   SpO2 100%   Physical Exam  Constitutional: She is oriented to person, place, and time. She appears well-developed and well-nourished. No distress.  HENT:  Head: Normocephalic and atraumatic.  Mouth/Throat: Oropharynx is clear and moist. No oropharyngeal exudate.  No tongue  or lip swelling.  Eyes: Conjunctivae and EOM are normal. Pupils are equal, round, and reactive to light.  Neck: Normal range of motion. Neck supple.  No meningismus.  Cardiovascular: Normal rate, regular rhythm, normal heart sounds and intact distal pulses.   No murmur heard. Pulmonary/Chest: Effort normal and breath sounds normal. No respiratory distress. She has no wheezes.  Abdominal: Soft. There is no tenderness. There is no rebound and no guarding.  Musculoskeletal: Normal range of motion. She exhibits no edema or tenderness.  Neurological: She is alert and oriented to person, place, and time. No cranial nerve deficit. She exhibits normal muscle tone. Coordination normal.  No ataxia on finger to nose bilaterally. No pronator drift. 5/5 strength throughout. CN 2-12  intact.Equal grip strength. Sensation intact.   Skin: Skin is warm.  Scattered urticarial rash to chest, arms, legs and back with excoriations.   Psychiatric: She has a normal mood and affect. Her behavior is normal.  Nursing note and vitals reviewed.    ED Treatments / Results  DIAGNOSTIC STUDIES:  Oxygen Saturation is 100% on RA, normal by my interpretation.    COORDINATION OF CARE:  11:07 PM Will order benadryl. Discussed treatment plan with pt at bedside and pt agreed to plan.   Labs (all labs ordered are listed, but only abnormal results are displayed) Labs Reviewed - No data to display  EKG  EKG Interpretation None       Radiology No results found.  Procedures Procedures (including critical care time)  Medications Ordered in ED Medications  diphenhydrAMINE (BENADRYL) capsule 25 mg (not administered)  predniSONE (DELTASONE) tablet 60 mg (not administered)  diphenhydrAMINE (BENADRYL) capsule 25 mg (25 mg Oral Given 10/20/16 1959)     Initial Impression / Assessment and Plan / ED Course  I have reviewed the triage vital signs and the nursing notes.  Pertinent labs & imaging results that were  available during my care of the patient were reviewed by me and considered in my medical decision making (see chart for details).     Patient with "hives all over" since 3 AM. Denies any new exposures. Last dose of Benadryl was at noon. No shortness of breath, difficulty swallowing or chest pain.  Patient has no tongue or lip swelling. She is not wheezing. She is given antihistamines and steroids for diffuse urticaria of uncertain etiology.  Patient is adamant that she needs to leave now. We'll prescribe course of antihistamines and steroids. No evidence of anaphylaxis. Return precautions discussed.  BP 134/98   Pulse 79   Temp 98.1 F (36.7 C) (Oral)   Resp 20   LMP 10/13/2016   SpO2 97%    Final Clinical Impressions(s) / ED Diagnoses   Final diagnoses:  Urticaria    New Prescriptions New Prescriptions   No medications on file  I personally performed the services described in this documentation, which was scribed in my presence. The recorded information has been reviewed and is accurate.     Ezequiel Essex, MD 10/21/16 [phone]

## 2016-10-20 NOTE — ED Notes (Signed)
Pt denies any changes to any products at home, and pt denies eating anything new.

## 2016-10-20 NOTE — Discharge Instructions (Signed)
Follow up with your doctor. Return to the ED if you develop difficulty breathing, difficulty swallowing, or any other concerns.

## 2016-10-20 NOTE — ED Triage Notes (Signed)
Pt reports hives all over body since 3 this morning. Pt doesn't know what she is allergic to. Reports taking benadryl today, last dose at noon. Denies SOB or tongue swelling.

## 2016-10-20 NOTE — ED Notes (Signed)
Pt is angry at long wait time. States she is leaving. Advised to stay by nurse and encouraged to stay by family. Pt sat down in w/a.

## 2016-11-04 ENCOUNTER — Telehealth (INDEPENDENT_AMBULATORY_CARE_PROVIDER_SITE_OTHER): Payer: Self-pay | Admitting: Orthopaedic Surgery

## 2016-11-04 NOTE — Telephone Encounter (Signed)
What would you like to do next

## 2016-11-04 NOTE — Telephone Encounter (Signed)
Ok she should take off from PT for 1 week.  Then resume as tolerated.

## 2016-11-04 NOTE — Telephone Encounter (Signed)
Patient called asked for a call back concerning her pain level. Patient said her pain level has increased and it might be due to her doing (PT) The number to contact her is [phone]

## 2016-11-05 NOTE — Telephone Encounter (Signed)
LMOM with message below.

## 2016-11-11 ENCOUNTER — Ambulatory Visit (INDEPENDENT_AMBULATORY_CARE_PROVIDER_SITE_OTHER): Payer: BLUE CROSS/BLUE SHIELD

## 2016-11-11 ENCOUNTER — Ambulatory Visit (INDEPENDENT_AMBULATORY_CARE_PROVIDER_SITE_OTHER): Payer: BLUE CROSS/BLUE SHIELD | Admitting: Orthopaedic Surgery

## 2016-11-11 ENCOUNTER — Encounter (INDEPENDENT_AMBULATORY_CARE_PROVIDER_SITE_OTHER): Payer: Self-pay | Admitting: Orthopaedic Surgery

## 2016-11-11 DIAGNOSIS — S42002D Fracture of unspecified part of left clavicle, subsequent encounter for fracture with routine healing: Secondary | ICD-10-CM | POA: Diagnosis not present

## 2016-11-11 NOTE — Progress Notes (Signed)
Rhona is almost 3 months s/p ORIF left clavicle fracture.  She's improving with PT.  Takes advil and robaxin at night as needed.  C/o mainly muscular soreness in the shoulder region.  The medial end of the plate slightly bothers her.  xrays show healed fracture.  Increase activity as tolerated.  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  F/u prn.

## 2017-01-13 ENCOUNTER — Telehealth (INDEPENDENT_AMBULATORY_CARE_PROVIDER_SITE_OTHER): Payer: Self-pay | Admitting: Orthopaedic Surgery

## 2017-01-13 NOTE — Telephone Encounter (Signed)
PT REQUESTS MEDICAL CLEARANCE LETTER FOR [facility] SO SHE CAN CONTINUE ON WITH THIS.  [phone]

## 2017-01-13 NOTE — Telephone Encounter (Signed)
See message.

## 2017-01-13 NOTE — Telephone Encounter (Signed)
yes

## 2017-01-14 NOTE — Telephone Encounter (Signed)
LMOM I need to know what she would like for me to put on the letter.

## 2017-01-16 ENCOUNTER — Telehealth (INDEPENDENT_AMBULATORY_CARE_PROVIDER_SITE_OTHER): Payer: Self-pay | Admitting: Orthopaedic Surgery

## 2017-01-16 NOTE — Telephone Encounter (Signed)
Patient called asking for the doctors note to be sent to her home address. Cb # [phone]

## 2017-01-19 NOTE — Telephone Encounter (Signed)
Printed will mail to pt.

## 2017-05-11 ENCOUNTER — Ambulatory Visit (INDEPENDENT_AMBULATORY_CARE_PROVIDER_SITE_OTHER): Payer: BLUE CROSS/BLUE SHIELD | Admitting: Orthopaedic Surgery

## 2017-05-11 DIAGNOSIS — S42002D Fracture of unspecified part of left clavicle, subsequent encounter for fracture with routine healing: Secondary | ICD-10-CM | POA: Diagnosis not present

## 2017-05-11 NOTE — Progress Notes (Signed)
Office Visit Note   Patient: Rebecca Adkins           Date of Birth: Aug 16, 1991           MRN: [id number] Visit Date: 05/11/2017              Requested by: No referring provider defined for this encounter. PCP: Patient, No Pcp Per   Assessment & Plan: Visit Diagnoses:  1. Closed displaced fracture of left clavicle with routine healing, unspecified part of clavicle, subsequent encounter     Plan: Overall impression is symptomatic hardware left clavicle. We discussed removal of the plate in the near future. She understands that this should improve her discomfort but is not a guarantee. She wishes to proceed. We'll see her back in a few weeks for x-rays and planning of surgery likely in the first week of October.  Follow-Up Instructions: Return if symptoms worsen or fail to improve.   Orders:  No orders of the defined types were placed in this encounter.  No orders of the defined types were placed in this encounter.     Procedures: No procedures performed   Clinical Data: No additional findings.   Subjective: Chief Complaint  Patient presents with  . Left Shoulder - Pain, Follow-up    Rebecca Adkins is 9 months status post ORIF left clavicle fracture. She comes in today with irritation from the left clavicle hardware. This is mainly on the medial half. She states that it really bothers her with her bra strap, per strep, seatbelt. She is overall doing okay otherwise. Denies any numbness and tingling in her hands. She has periincisional numbness.    Review of Systems   Objective: Vital Signs: There were no vitals taken for this visit.  Physical Exam  Ortho Exam Left shoulder exam shows a fully healed surgical scar. The clavicle plate is prominent more so on the medial side. This is tender to palpation and with mobilization of the skin. There is no signs of infection. Specialty Comments:  No specialty comments available.  Imaging: No results found.   PMFS  History: Patient Active Problem List   Diagnosis Date Noted  . Closed displaced fracture of left clavicle 08/19/2016   Past Medical History:  Diagnosis Date  . Cancer (HCC)    cervical  . Clavicle fracture    left  . Elevated liver enzymes   . GERD (gastroesophageal reflux disease)   . Headache   . PONV (postoperative nausea and vomiting)   . Seizures ([place])    as a child only    Family History  Problem Relation Age of Onset  . Hypertension Mother   . Diabetes Other   . Hypertension Other   . COPD Other   . Other Other     Past Surgical History:  Procedure Laterality Date  . DILATION AND CURETTAGE OF UTERUS    . ORIF CLAVICULAR FRACTURE Left 08/21/2016   Procedure: OPEN REDUCTION INTERNAL FIXATION (ORIF) LEFT CLAVICLE FRACTURE;  Surgeon: Leandrew Koyanagi, MD;  Location: [facility];  Service: Orthopedics;  Laterality: Left;  . OVARY SURGERY     Social History   Occupational History  . Not on file.   Social History Main Topics  . Smoking status: Current Every Day Smoker    Packs/day: 1.00    Types: Cigarettes    Last attempt to quit: 08/26/2016  . Smokeless tobacco: Never Used     Comment: PT STATES SHE QUIT SMOKING 1 WEEK AGO  . Alcohol  use Yes     Comment: ocassional  . Drug use: No  . Sexual activity: Yes    Birth control/ protection: Pill

## 2017-05-20 ENCOUNTER — Telehealth (INDEPENDENT_AMBULATORY_CARE_PROVIDER_SITE_OTHER): Payer: Self-pay | Admitting: Orthopaedic Surgery

## 2017-05-20 NOTE — Telephone Encounter (Signed)
Okay that's fine.  Please call to make appointment. Thank you.

## 2017-06-02 ENCOUNTER — Ambulatory Visit (INDEPENDENT_AMBULATORY_CARE_PROVIDER_SITE_OTHER): Payer: BLUE CROSS/BLUE SHIELD | Admitting: Orthopaedic Surgery

## 2017-06-02 ENCOUNTER — Encounter (INDEPENDENT_AMBULATORY_CARE_PROVIDER_SITE_OTHER): Payer: Self-pay | Admitting: Orthopaedic Surgery

## 2017-06-02 ENCOUNTER — Ambulatory Visit (INDEPENDENT_AMBULATORY_CARE_PROVIDER_SITE_OTHER): Payer: BLUE CROSS/BLUE SHIELD

## 2017-06-02 DIAGNOSIS — S42022D Displaced fracture of shaft of left clavicle, subsequent encounter for fracture with routine healing: Secondary | ICD-10-CM

## 2017-06-02 NOTE — Progress Notes (Signed)
   Office Visit Note   Patient: Rebecca Adkins           Date of Birth: [birth year]           MRN: [id number] Visit Date: 06/02/2017              Requested by: No referring provider defined for this encounter. PCP: Patient, No Pcp Per   Assessment & Plan: Visit Diagnoses:  1. Closed displaced fracture of shaft of left clavicle with routine healing, subsequent encounter     Plan: Patient has symptomatic left clavicle hardware. At this point patient would like to have this removed as soon as possible. We discussed risks benefits alternatives to surgery she understands and wished to proceed. We will get her scheduled in the near future.  Follow-Up Instructions: Return for 2 week postop visit.   Orders:  Orders Placed This Encounter  Procedures  . XR Clavicle Left   No orders of the defined types were placed in this encounter.     Procedures: No procedures performed   Clinical Data: No additional findings.   Subjective: Chief Complaint  Patient presents with  . Left Shoulder - Pain, Follow-up    Chest, is 9 months status post ORIF of a left clavicle fracture. She follows up today for her continue symptomatic hardware. She would like to have the plate removed. Denies any changes in medical history.    Review of Systems   Objective: Vital Signs: There were no vitals taken for this visit.  Physical Exam  Ortho Exam Left clavicle exam shows a fully healed surgical scar. She does have prominence of her plate especially on the medial aspect. This is tender to palpation. Specialty Comments:  No specialty comments available.  Imaging: Xr Clavicle Left  Result Date: 06/02/2017 Stable clavicle fixation    PMFS History: Patient Active Problem List   Diagnosis Date Noted  . Closed displaced fracture of left clavicle 08/19/2016   Past Medical History:  Diagnosis Date  . Cancer (HCC)    cervical  . Clavicle fracture    left  . Elevated liver enzymes   .  GERD (gastroesophageal reflux disease)   . Headache   . PONV (postoperative nausea and vomiting)   . Seizures ([place])    as a child only    Family History  Problem Relation Age of Onset  . Hypertension Mother   . Diabetes Other   . Hypertension Other   . COPD Other   . Other Other     Past Surgical History:  Procedure Laterality Date  . DILATION AND CURETTAGE OF UTERUS    . ORIF CLAVICULAR FRACTURE Left 08/21/2016   Procedure: OPEN REDUCTION INTERNAL FIXATION (ORIF) LEFT CLAVICLE FRACTURE;  Surgeon: Leandrew Koyanagi, MD;  Location: [facility];  Service: Orthopedics;  Laterality: Left;  . OVARY SURGERY     Social History   Occupational History  . Not on file.   Social History Main Topics  . Smoking status: Current Every Day Smoker    Packs/day: 1.00    Types: Cigarettes    Last attempt to quit: 08/26/2016  . Smokeless tobacco: Never Used     Comment: PT STATES SHE QUIT SMOKING 1 WEEK AGO  . Alcohol use Yes     Comment: ocassional  . Drug use: No  . Sexual activity: Yes    Birth control/ protection: Pill

## 2017-06-09 ENCOUNTER — Ambulatory Visit (INDEPENDENT_AMBULATORY_CARE_PROVIDER_SITE_OTHER): Payer: PRIVATE HEALTH INSURANCE | Admitting: Orthopaedic Surgery

## 2017-06-12 DIAGNOSIS — T8484XA Pain due to internal orthopedic prosthetic devices, implants and grafts, initial encounter: Secondary | ICD-10-CM

## 2017-06-15 ENCOUNTER — Telehealth (INDEPENDENT_AMBULATORY_CARE_PROVIDER_SITE_OTHER): Payer: Self-pay | Admitting: Orthopaedic Surgery

## 2017-06-15 NOTE — Telephone Encounter (Signed)
Pt left message just asking for a return call from Dr Erlinda Hong or his assistant, she has questions regarding the surgery she had last week.

## 2017-06-16 ENCOUNTER — Other Ambulatory Visit (INDEPENDENT_AMBULATORY_CARE_PROVIDER_SITE_OTHER): Payer: Self-pay

## 2017-06-16 MED ORDER — OXYCODONE-ACETAMINOPHEN 5-325 MG PO TABS: 1.0000 | ORAL_TABLET | Freq: Two times a day (BID) | ORAL | 0 refills | Status: DC

## 2017-06-16 NOTE — Telephone Encounter (Signed)
Tried to call patient no answer,  Rebecca Adkins. Returning her call

## 2017-06-23 ENCOUNTER — Ambulatory Visit (INDEPENDENT_AMBULATORY_CARE_PROVIDER_SITE_OTHER): Payer: BLUE CROSS/BLUE SHIELD | Admitting: Orthopaedic Surgery

## 2017-06-23 DIAGNOSIS — S42022D Displaced fracture of shaft of left clavicle, subsequent encounter for fracture with routine healing: Secondary | ICD-10-CM

## 2017-06-23 MED ORDER — OXYCODONE HCL 5 MG PO TABS: 5.0000 mg | ORAL_TABLET | Freq: Every day | ORAL | 0 refills | Status: DC | PRN

## 2017-06-23 NOTE — Progress Notes (Signed)
Patient is 10 days status post left clavicle hardware removal. She comes in today for fever vomiting diarrhea rash. She states she still has more pain than she expected related to the surgery. She takes oxycodone for the pain. Denies any drainage from the incision.  Surgical incision is clean dry and intact without any evidence of infection. This is tender to palpation. Her motion is limited secondary to pain.  From a surgical incision standpoint I do not see any evidence of infection. Think she may have contracted a stomach virus. Oxycodone was refilled. I recommend that she see her primary care doctor or go to an urgent care for her constitutional symptoms. I recommend resuming her home exercises to help strengthen her shoulder. Questions encouraged and answered. Follow-up as needed.

## 2017-06-26 ENCOUNTER — Ambulatory Visit (INDEPENDENT_AMBULATORY_CARE_PROVIDER_SITE_OTHER): Payer: PRIVATE HEALTH INSURANCE | Admitting: Orthopaedic Surgery

## 2017-06-30 ENCOUNTER — Ambulatory Visit (INDEPENDENT_AMBULATORY_CARE_PROVIDER_SITE_OTHER): Payer: BLUE CROSS/BLUE SHIELD | Admitting: Orthopaedic Surgery

## 2017-06-30 ENCOUNTER — Ambulatory Visit (INDEPENDENT_AMBULATORY_CARE_PROVIDER_SITE_OTHER): Payer: BLUE CROSS/BLUE SHIELD

## 2017-06-30 DIAGNOSIS — S40012A Contusion of left shoulder, initial encounter: Secondary | ICD-10-CM

## 2017-06-30 DIAGNOSIS — S42022D Displaced fracture of shaft of left clavicle, subsequent encounter for fracture with routine healing: Secondary | ICD-10-CM | POA: Diagnosis not present

## 2017-06-30 MED ORDER — OXYCODONE HCL 5 MG PO TABS: 5.0000 mg | ORAL_TABLET | Freq: Three times a day (TID) | ORAL | 0 refills | Status: DC | PRN

## 2017-06-30 NOTE — Progress Notes (Signed)
Office Visit Note   Patient: Valjean Ruppel           Date of Birth: 09-26-1990           MRN: [id number] Visit Date: 06/30/2017              Requested by: No referring provider defined for this encounter. PCP: Patient, No Pcp Per   Assessment & Plan: Visit Diagnoses:  1. Closed displaced fracture of shaft of left clavicle with routine healing, subsequent encounter   2. Contusion of left shoulder, initial encounter     Plan: X-rays do not show any obvious evidence of refracture. Overall impression is left shoulder contusion. Prescription for oxycodone. Resume home exercises when able. Ice and heat as indicated. Follow-up as needed.  Follow-Up Instructions: Return if symptoms worsen or fail to improve.   Orders:  Orders Placed This Encounter  Procedures  . XR Clavicle Left   Meds ordered this encounter  Medications  . oxyCODONE (OXY IR/ROXICODONE) 5 MG immediate release tablet    Sig: Take 1-2 tablets (5-10 mg total) by mouth 3 (three) times daily as needed.    Dispense:  30 tablet    Refill:  0      Procedures: No procedures performed   Clinical Data: No additional findings.   Subjective: Chief Complaint  Patient presents with  . Left Shoulder - Pain    Evellyn comes in today with a new problem of fall onto her left shoulder yesterday. She is status post left clavicle plate hardware removal. She is having worsening pain since the fall. She is had to take more oxycodone now.    Review of Systems   Objective: Vital Signs: There were no vitals taken for this visit.  Physical Exam  Ortho Exam Left shoulder exam shows a healed surgical incision without any evidence of drainage or infection. She has no focal motor sensory deficits. She does have discomfort with elevation of the arm. Specialty Comments:  No specialty comments available.  Imaging: Xr Clavicle Left  Result Date: 06/30/2017 No definitive evidence of new fracture    PMFS  History: Patient Active Problem List   Diagnosis Date Noted  . Closed displaced fracture of left clavicle 08/19/2016   Past Medical History:  Diagnosis Date  . Cancer (HCC)    cervical  . Clavicle fracture    left  . Elevated liver enzymes   . GERD (gastroesophageal reflux disease)   . Headache   . PONV (postoperative nausea and vomiting)   . Seizures ([place])    as a child only    Family History  Problem Relation Age of Onset  . Hypertension Mother   . Diabetes Other   . Hypertension Other   . COPD Other   . Other Other     Past Surgical History:  Procedure Laterality Date  . DILATION AND CURETTAGE OF UTERUS    . ORIF CLAVICULAR FRACTURE Left 08/21/2016   Procedure: OPEN REDUCTION INTERNAL FIXATION (ORIF) LEFT CLAVICLE FRACTURE;  Surgeon: Leandrew Koyanagi, MD;  Location: [place];  Service: Orthopedics;  Laterality: Left;  . OVARY SURGERY     Social History   Occupational History  . Not on file.   Social History Main Topics  . Smoking status: Current Every Day Smoker    Packs/day: 1.00    Types: Cigarettes    Last attempt to quit: 08/26/2016  . Smokeless tobacco: Never Used     Comment: PT STATES SHE QUIT SMOKING 1  WEEK AGO  . Alcohol use Yes     Comment: ocassional  . Drug use: No  . Sexual activity: Yes    Birth control/ protection: Pill

## 2017-07-15 NOTE — Telephone Encounter (Signed)
Note made in error

## 2017-07-28 ENCOUNTER — Telehealth (INDEPENDENT_AMBULATORY_CARE_PROVIDER_SITE_OTHER): Payer: Self-pay | Admitting: Orthopaedic Surgery

## 2017-07-28 NOTE — Telephone Encounter (Signed)
Please advise 

## 2017-07-28 NOTE — Telephone Encounter (Signed)
Patient called advised she is still in a lot of pain after surgery. Patient wanted to know what she should do at this point. Patient said she is still doing (PT) from home 3 weeks ago. The number to contact patient is [phone]

## 2017-07-28 NOTE — Telephone Encounter (Signed)
It's likely just the muscles healing back to the bone.  We can send in some pain meds if she needs some

## 2017-07-29 ENCOUNTER — Telehealth (INDEPENDENT_AMBULATORY_CARE_PROVIDER_SITE_OTHER): Payer: Self-pay | Admitting: Orthopaedic Surgery

## 2017-07-29 MED ORDER — HYDROCODONE-ACETAMINOPHEN 5-325 MG PO TABS: ORAL_TABLET | ORAL | 0 refills | Status: DC

## 2017-07-29 NOTE — Telephone Encounter (Signed)
What Rx would you like for me to send in ? 

## 2017-07-29 NOTE — Telephone Encounter (Signed)
Called patient & LMOM with Detailed message (advised on message below.) We can send something for pain. We need to know what pharmacy she uses.

## 2017-07-29 NOTE — Telephone Encounter (Signed)
[facility] #20.  1 tab po bid prn

## 2017-07-29 NOTE — Telephone Encounter (Signed)
Patient returned your call, she said she uses the [facility] on [address] CB # [phone]

## 2017-07-29 NOTE — Telephone Encounter (Signed)
Patient returned your call, she said she uses the [facility] on [address] CB # [phone]

## 2017-07-29 NOTE — Telephone Encounter (Signed)
Rx pending [facility].  Rx will be ready for pick up tomorrow AM here in our office.

## 2017-07-30 ENCOUNTER — Other Ambulatory Visit (INDEPENDENT_AMBULATORY_CARE_PROVIDER_SITE_OTHER): Payer: Self-pay

## 2017-10-02 DIAGNOSIS — N911 Secondary amenorrhea: Secondary | ICD-10-CM | POA: Diagnosis not present

## 2017-10-02 DIAGNOSIS — N6324 Unspecified lump in the left breast, lower inner quadrant: Secondary | ICD-10-CM | POA: Diagnosis not present

## 2017-10-08 ENCOUNTER — Telehealth (INDEPENDENT_AMBULATORY_CARE_PROVIDER_SITE_OTHER): Payer: Self-pay | Admitting: Orthopaedic Surgery

## 2017-10-08 NOTE — Telephone Encounter (Signed)
Patient had surgery in 07/2016 one her left clavicle, she said she has sparatic pains and it concerns her since its been so long. She would like a call back with some advice on what to do. CB# [phone]

## 2017-10-09 NOTE — Telephone Encounter (Signed)
She also had surgery recently for plate removal which causes scarring and trauma to the soft tissues which will have to take time to heal again.  If it's sporadic, then it's normal but if it becomes constant and frequent then we are happy to evaluate her

## 2017-10-09 NOTE — Telephone Encounter (Signed)
Please advise 

## 2017-10-12 NOTE — Telephone Encounter (Signed)
Tried to call patient no answer. If she calls back please advise on message below. Thank you.

## 2017-10-14 NOTE — Telephone Encounter (Signed)
Called patient again no answer LMOM with details below.

## 2017-10-16 ENCOUNTER — Encounter (INDEPENDENT_AMBULATORY_CARE_PROVIDER_SITE_OTHER): Payer: Self-pay | Admitting: Orthopaedic Surgery

## 2017-10-16 ENCOUNTER — Ambulatory Visit (INDEPENDENT_AMBULATORY_CARE_PROVIDER_SITE_OTHER): Payer: BLUE CROSS/BLUE SHIELD | Admitting: Orthopaedic Surgery

## 2017-10-16 DIAGNOSIS — S42022D Displaced fracture of shaft of left clavicle, subsequent encounter for fracture with routine healing: Secondary | ICD-10-CM | POA: Diagnosis not present

## 2017-10-16 MED ORDER — DICLOFENAC SODIUM 1 % TD GEL: 2.0000 g | Freq: Four times a day (QID) | TRANSDERMAL | 5 refills | Status: DC

## 2017-10-16 NOTE — Progress Notes (Signed)
Office Visit Note   Patient: Rebecca Adkins           Date of Birth: 11/24/1990           MRN: [id number] Visit Date: 10/16/2017              Requested by: No referring provider defined for this encounter. PCP: Patient, No Pcp Per   Assessment & Plan: Visit Diagnoses:  1. Closed displaced fracture of shaft of left clavicle with routine healing, subsequent encounter     Plan: Impression is 27 year old female with left shoulder pain from previous hardware removal about 3-4 months ago.  I think what she is complaining of is postsurgical and related to scar tissue and overall deconditioning.  I recommend that she take ibuprofen and Tylenol as needed.  Voltaren gel was prescribed today.  Continue aggressive home exercises for strengthening.  Questions encouraged and answered.  Follow-up as needed.  Follow-Up Instructions: Return if symptoms worsen or fail to improve.   Orders:  No orders of the defined types were placed in this encounter.  Meds ordered this encounter  Medications  . diclofenac sodium (VOLTAREN) 1 % GEL    Sig: Apply 2 g topically 4 (four) times daily.    Dispense:  1 Tube    Refill:  5      Procedures: No procedures performed   Clinical Data: No additional findings.   Subjective: Chief Complaint  Patient presents with  . Left Shoulder - Pain    Justin follows up today for her left shoulder pain.  She has stiffness in occasional pain.  It is worse after activity.  Worse at night when she is trying to sleep.  Denies any numbness and tingling.    Review of Systems  Constitutional: Negative.   HENT: Negative.   Eyes: Negative.   Respiratory: Negative.   Cardiovascular: Negative.   Endocrine: Negative.   Musculoskeletal: Negative.   Neurological: Negative.   Hematological: Negative.   Psychiatric/Behavioral: Negative.   All other systems reviewed and are negative.    Objective: Vital Signs: There were no vitals taken for this  visit.  Physical Exam  Constitutional: She is oriented to person, place, and time. She appears well-developed and well-nourished.  HENT:  Head: Normocephalic and atraumatic.  Eyes: EOM are normal.  Neck: Neck supple.  Pulmonary/Chest: Effort normal.  Abdominal: Soft.  Neurological: She is alert and oriented to person, place, and time.  Skin: Skin is warm. Capillary refill takes less than 2 seconds.  Psychiatric: She has a normal mood and affect. Her behavior is normal. Judgment and thought content normal.  Nursing note and vitals reviewed.   Ortho Exam Left shoulder exam shows full range of motion with some discomfort at the extremes of range of motion.  Nonfocal exam.  Normal rotator cuff function.  Surgical scar is fully healed without swelling. Specialty Comments:  No specialty comments available.  Imaging: No results found.   PMFS History: Patient Active Problem List   Diagnosis Date Noted  . Closed displaced fracture of left clavicle 08/19/2016   Past Medical History:  Diagnosis Date  . Cancer (HCC)    cervical  . Clavicle fracture    left  . Elevated liver enzymes   . GERD (gastroesophageal reflux disease)   . Headache   . PONV (postoperative nausea and vomiting)   . Seizures ([place])    as a child only    Family History  Problem Relation Age of Onset  .  Hypertension Mother   . Diabetes Other   . Hypertension Other   . COPD Other   . Other Other     Past Surgical History:  Procedure Laterality Date  . DILATION AND CURETTAGE OF UTERUS    . ORIF CLAVICULAR FRACTURE Left 08/21/2016   Procedure: OPEN REDUCTION INTERNAL FIXATION (ORIF) LEFT CLAVICLE FRACTURE;  Surgeon: Leandrew Koyanagi, MD;  Location: [place];  Service: Orthopedics;  Laterality: Left;  . OVARY SURGERY     Social History   Occupational History  . Not on file  Tobacco Use  . Smoking status: Current Every Day Smoker    Packs/day: 1.00    Types: Cigarettes    Last attempt to quit: 08/26/2016     Years since quitting: 1.1  . Smokeless tobacco: Never Used  . Tobacco comment: PT STATES SHE QUIT SMOKING 1 WEEK AGO  Substance and Sexual Activity  . Alcohol use: Yes    Comment: ocassional  . Drug use: No  . Sexual activity: Yes    Birth control/protection: Pill

## 2017-10-23 DIAGNOSIS — N6324 Unspecified lump in the left breast, lower inner quadrant: Secondary | ICD-10-CM | POA: Diagnosis not present

## 2017-12-31 DIAGNOSIS — Z118 Encounter for screening for other infectious and parasitic diseases: Secondary | ICD-10-CM | POA: Diagnosis not present

## 2017-12-31 DIAGNOSIS — N941 Unspecified dyspareunia: Secondary | ICD-10-CM | POA: Diagnosis not present

## 2017-12-31 DIAGNOSIS — Z01419 Encounter for gynecological examination (general) (routine) without abnormal findings: Secondary | ICD-10-CM | POA: Diagnosis not present

## 2017-12-31 DIAGNOSIS — N719 Inflammatory disease of uterus, unspecified: Secondary | ICD-10-CM | POA: Diagnosis not present

## 2017-12-31 DIAGNOSIS — N809 Endometriosis, unspecified: Secondary | ICD-10-CM | POA: Diagnosis not present

## 2017-12-31 DIAGNOSIS — R102 Pelvic and perineal pain: Secondary | ICD-10-CM | POA: Diagnosis not present

## 2017-12-31 DIAGNOSIS — Z6831 Body mass index (BMI) 31.0-31.9, adult: Secondary | ICD-10-CM | POA: Diagnosis not present

## 2018-02-04 DIAGNOSIS — E669 Obesity, unspecified: Secondary | ICD-10-CM | POA: Diagnosis not present

## 2018-02-04 DIAGNOSIS — Z6831 Body mass index (BMI) 31.0-31.9, adult: Secondary | ICD-10-CM | POA: Diagnosis not present

## 2018-02-04 DIAGNOSIS — Z01818 Encounter for other preprocedural examination: Secondary | ICD-10-CM | POA: Diagnosis not present

## 2018-02-04 DIAGNOSIS — G8929 Other chronic pain: Secondary | ICD-10-CM | POA: Diagnosis not present

## 2018-02-08 DIAGNOSIS — M21611 Bunion of right foot: Secondary | ICD-10-CM | POA: Diagnosis not present

## 2018-02-08 DIAGNOSIS — M205X1 Other deformities of toe(s) (acquired), right foot: Secondary | ICD-10-CM | POA: Diagnosis not present

## 2018-02-08 DIAGNOSIS — M21621 Bunionette of right foot: Secondary | ICD-10-CM | POA: Diagnosis not present

## 2018-02-08 MED FILL — OXYCODONE-ACETAMINOPHEN 5-3: 5-325 | 5 days supply | Qty: 30 | Fill #0
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
## 2018-03-19 IMAGING — RF DG CLAVICLE*L*
1 series · 2 of 2 positions shown · non-contrast
Comparison: LEFT clavicle radiograph August 17, 2016

CLINICAL DATA: LEFT clavicle ORIF.

EXAM:
LEFT CLAVICLE - 2+ VIEWS; DG C-ARM 61-120 MIN
FLUOROSCOPY TIME:  17 seconds

[Series 1: run · 2 of 2 slices shown]
[im 1/2]
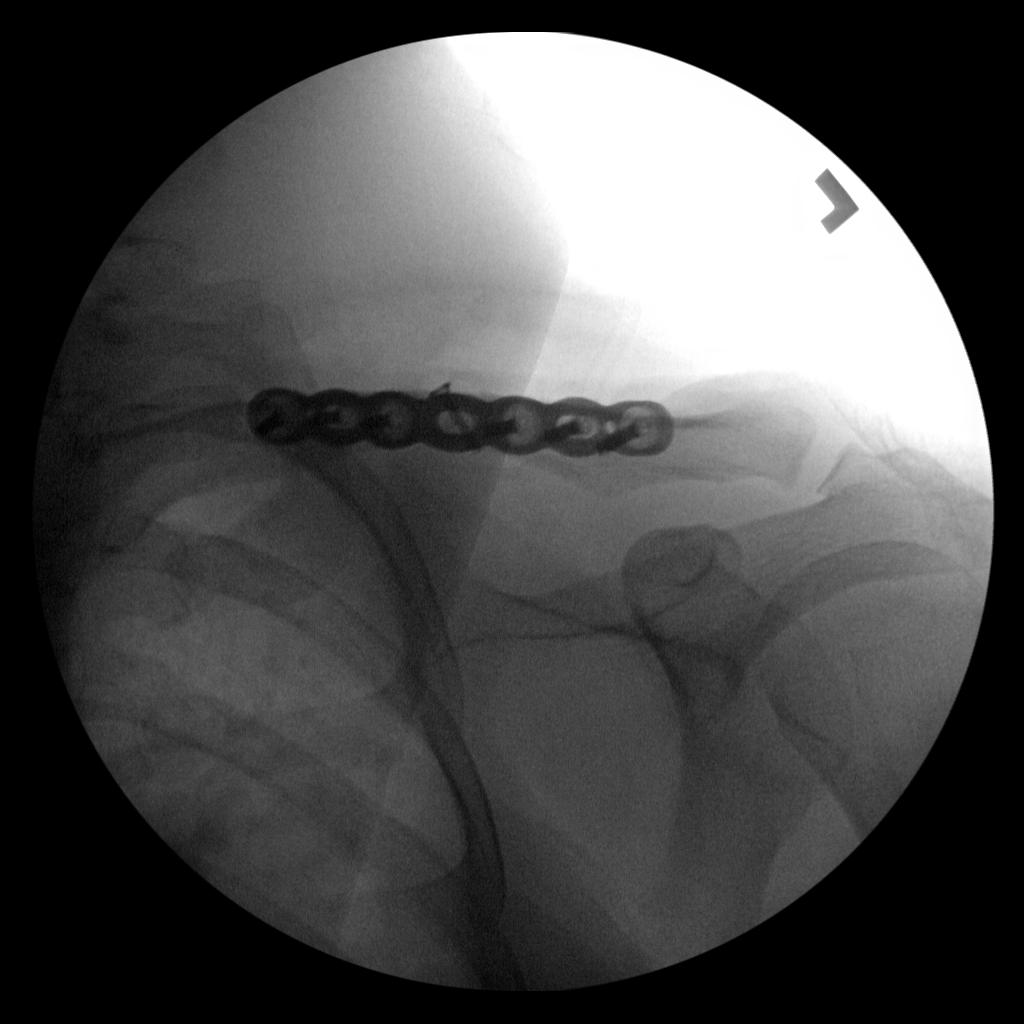
[im 2/2]
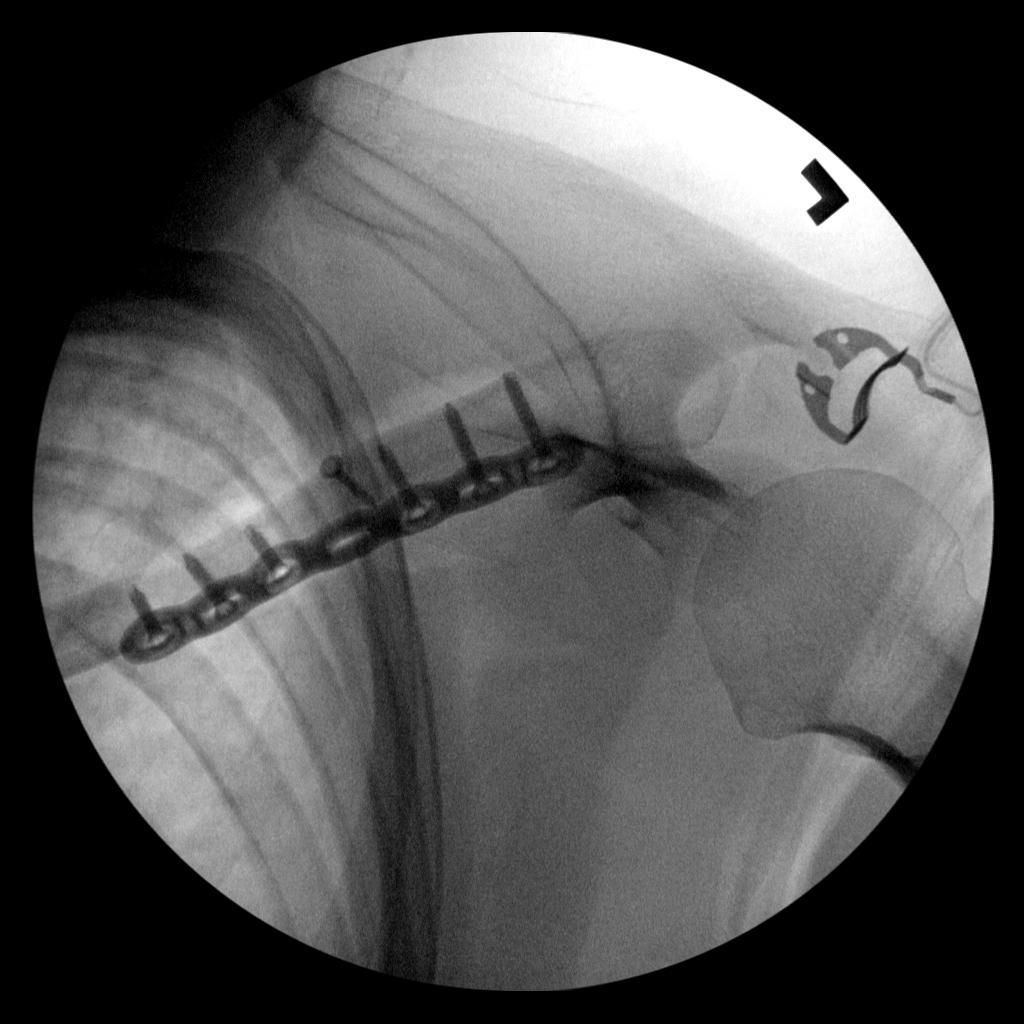

[2 of 2 positions shown; findings below may reference images not displayed]

FINDINGS: Two fluoroscopic views of the LEFT clavicle submitted, interpreting
radiologist was not present at time of procedure. Plate and screw
fixation of nondisplaced LEFT clavicle fracture.
IMPRESSION: LEFT clavicle ORIF.

## 2018-04-01 DIAGNOSIS — Z803 Family history of malignant neoplasm of breast: Secondary | ICD-10-CM | POA: Diagnosis not present

## 2018-04-17 DIAGNOSIS — F3289 Other specified depressive episodes: Secondary | ICD-10-CM | POA: Diagnosis not present

## 2018-05-17 ENCOUNTER — Telehealth: Payer: Self-pay | Admitting: Genetic Counselor

## 2018-05-17 ENCOUNTER — Encounter: Payer: Self-pay | Admitting: Genetic Counselor

## 2018-05-17 NOTE — Telephone Encounter (Signed)
Received a genetic counseling referral from [employer] for fhx of breast cancer. Pt has been scheduled to see Rebecca Adkins on 9/23 at 2pm. Letter mailed to the pt.

## 2018-06-01 ENCOUNTER — Other Ambulatory Visit: Payer: Self-pay

## 2018-06-01 ENCOUNTER — Emergency Department (HOSPITAL_COMMUNITY)
Admission: EM | Admit: 2018-06-01 | Discharge: 2018-06-01 | Disposition: A | Discharge status: Home / Self Care | Payer: BLUE CROSS/BLUE SHIELD | Attending: Emergency Medicine | Admitting: Emergency Medicine

## 2018-06-01 DIAGNOSIS — Z79899 Other long term (current) drug therapy: Secondary | ICD-10-CM | POA: Diagnosis not present

## 2018-06-01 DIAGNOSIS — T782XXA Anaphylactic shock, unspecified, initial encounter: Secondary | ICD-10-CM | POA: Insufficient documentation

## 2018-06-01 DIAGNOSIS — T7840XA Allergy, unspecified, initial encounter: Secondary | ICD-10-CM | POA: Diagnosis not present

## 2018-06-01 DIAGNOSIS — L509 Urticaria, unspecified: Secondary | ICD-10-CM | POA: Diagnosis not present

## 2018-06-01 DIAGNOSIS — R0902 Hypoxemia: Secondary | ICD-10-CM | POA: Diagnosis not present

## 2018-06-01 DIAGNOSIS — F17210 Nicotine dependence, cigarettes, uncomplicated: Secondary | ICD-10-CM | POA: Diagnosis not present

## 2018-06-01 MED ORDER — SODIUM CHLORIDE 0.9 % IV BOLUS
1000.0000 mL | Freq: Once | INTRAVENOUS | Status: AC
  Administered 2018-06-01: 1000 mL via INTRAVENOUS

## 2018-06-01 MED ORDER — EPINEPHRINE 0.3 MG/0.3ML IJ SOAJ: 0.3000 mg | Freq: Once | INTRAMUSCULAR | 0 refills | Status: AC

## 2018-06-01 MED ORDER — PREDNISONE 20 MG PO TABS: 40.0000 mg | ORAL_TABLET | Freq: Every day | ORAL | 0 refills | Status: AC

## 2018-06-01 MED ORDER — DIPHENHYDRAMINE HCL 50 MG/ML IJ SOLN
25.0000 mg | Freq: Once | INTRAMUSCULAR | Status: DC
  Administered 2018-06-01: 25 mg via INTRAVENOUS
  Filled 2018-06-01: qty 1

## 2018-06-01 MED ORDER — METHYLPREDNISOLONE SODIUM SUCC 125 MG IJ SOLR
125.0000 mg | Freq: Once | INTRAMUSCULAR | Status: AC
  Administered 2018-06-01: 125 mg via INTRAVENOUS
  Filled 2018-06-01: qty 2

## 2018-06-01 MED ORDER — FAMOTIDINE IN NACL 20-0.9 MG/50ML-% IV SOLN
20.0000 mg | Freq: Once | INTRAVENOUS | Status: AC
  Administered 2018-06-01: 20 mg via INTRAVENOUS
  Filled 2018-06-01: qty 50

## 2018-06-01 NOTE — Discharge Instructions (Signed)
Please take prednisone for the next 5 days as directed.  If you develop any mild symptoms you can also take Benadryl and Pepcid in addition to this.  You have been prescribed an EpiPen to use if you have similar severe reaction in the future.  These follow-up with your primary care doctor.  Return to the emergency department if you have to use your EpiPen, or if you develop any facial swelling, difficulty breathing, or any other new or concerning symptoms.

## 2018-06-01 NOTE — ED Notes (Signed)
Patient verbalizes understanding of discharge instructions. Opportunity for questioning and answers were provided. Armband removed by staff, pt discharged from ED ambulatory.   

## 2018-06-01 NOTE — ED Provider Notes (Signed)
[facility] EMERGENCY DEPARTMENT Provider Note   CSN: [id number] Arrival date & time: 06/01/18  1740     History   Chief Complaint Chief Complaint  Patient presents with  . Allergic Reaction    HPI Rebecca Adkins is a 27 y.o. female.  Rebecca Adkins is a 27 y.o. Female with history of seizures as a child, postop nausea vomiting, GERD, who presents to the emergency department via EMS for evaluation of allergic reaction.  Patient was at her dentist office having a procedure, and received Septocaine 4% and sodium metabisulfite.  Patient started to feel a scratchy feeling in her throat and then she developed diffuse hives and became extremely lightheaded and started to pass out, her boyfriend caught her and she did not fall to the ground.  She also reports feeling extremely nauseous but did not have any episodes of vomiting.  She felt like her lips and tongue were starting to swell and she was having difficulty breathing.  On scene patient was initially found to be hypotensive.  Patient was given 1 dose of epi and 50 mg Benadryl IM, 4 mg Zofran with EMS with drastic improvement in symptoms.  On arrival to the ED patient denies any throat closing sensation or difficulty breathing, no swelling of the face, lips or tongue.  Mild nausea, no lightheadedness, chest pain or shortness of breath.  Reports she is had more mild allergic reactions in the past, but has never had one this severe.      Past Medical History:  Diagnosis Date  . Cancer (HCC)    cervical  . Clavicle fracture    left  . Elevated liver enzymes   . GERD (gastroesophageal reflux disease)   . Headache   . PONV (postoperative nausea and vomiting)   . Seizures ([place])    as a child only    Patient Active Problem List   Diagnosis Date Noted  . Closed displaced fracture of left clavicle 08/19/2016    Past Surgical History:  Procedure Laterality Date  . DILATION AND CURETTAGE OF UTERUS    . ORIF  CLAVICULAR FRACTURE Left 08/21/2016   Procedure: OPEN REDUCTION INTERNAL FIXATION (ORIF) LEFT CLAVICLE FRACTURE;  Surgeon: Leandrew Koyanagi, MD;  Location: [facility];  Service: Orthopedics;  Laterality: Left;  . OVARY SURGERY       OB History   None      Home Medications    Prior to Admission medications   Medication Sig Start Date End Date Taking? Authorizing Provider  diclofenac sodium (VOLTAREN) 1 % GEL Apply 2 g topically 4 (four) times daily. 10/16/17   Leandrew Koyanagi, MD  diphenhydrAMINE (BENADRYL) 25 MG tablet Take 1 tablet (25 mg total) by mouth every 6 (six) hours. 10/20/16   Rancour, Annie Main, MD  Docusate Calcium (STOOL SOFTENER PO) Take 1 tablet by mouth daily as needed (constipation).    [provider]  EPINEPHrine 0.3 mg/0.3 mL IJ SOAJ injection Inject 0.3 mLs (0.3 mg total) into the muscle once for 1 dose. 06/01/18 06/01/18  Jacqlyn Larsen, PA-C  famotidine (PEPCID) 20 MG tablet Take 1 tablet (20 mg total) by mouth 2 (two) times daily. 10/20/16   Rancour, Annie Main, MD  HYDROcodone-acetaminophen (NORCO) 5-325 MG tablet Take 1 tablet by mouth at bedtime as needed. 09/29/16   Leandrew Koyanagi, MD  HYDROcodone-acetaminophen (NORCO/VICODIN) 5-325 MG tablet 1 tab po bid prn 07/29/17   Leandrew Koyanagi, MD  ibuprofen (ADVIL,MOTRIN) 200 MG tablet Take 800  mg by mouth every 6 (six) hours as needed for mild pain.    [provider]  methocarbamol (ROBAXIN) 500 MG tablet Take 1 tablet (500 mg total) by mouth every 6 (six) hours as needed for muscle spasms. 08/19/16   Leandrew Koyanagi, MD  methocarbamol (ROBAXIN) 750 MG tablet Take 1 tablet (750 mg total) by mouth 2 (two) times daily as needed for muscle spasms. 08/21/16   Leandrew Koyanagi, MD  Multiple Vitamin (MULTIVITAMIN WITH MINERALS) TABS tablet Take 1 tablet by mouth daily.    [provider]  ondansetron (ZOFRAN) 4 MG tablet Take 1 tablet (4 mg total) by mouth every 8 (eight) hours as needed for nausea or vomiting. 08/17/16    Duffy Bruce, MD  ondansetron (ZOFRAN) 4 MG tablet Take 1-2 tablets (4-8 mg total) by mouth every 8 (eight) hours as needed for nausea or vomiting. 08/21/16   Leandrew Koyanagi, MD  oxyCODONE (OXY IR/ROXICODONE) 5 MG immediate release tablet Take 1-2 tablets (5-10 mg total) by mouth daily as needed. 06/23/17   Leandrew Koyanagi, MD  oxyCODONE (OXY IR/ROXICODONE) 5 MG immediate release tablet Take 1-2 tablets (5-10 mg total) by mouth 3 (three) times daily as needed. 06/30/17   Leandrew Koyanagi, MD  oxyCODONE (OXYCONTIN) 10 mg 12 hr tablet Take 1 tablet (10 mg total) by mouth every 12 (twelve) hours. 08/21/16   Leandrew Koyanagi, MD  oxyCODONE-acetaminophen (PERCOCET) 5-325 MG tablet Take 1-2 tablets by mouth every 4 (four) hours as needed for severe pain. 08/21/16   Leandrew Koyanagi, MD  oxyCODONE-acetaminophen (PERCOCET) 5-325 MG tablet Take 1 tablet by mouth 2 (two) times daily as needed for severe pain. 09/02/16   Leandrew Koyanagi, MD  oxyCODONE-acetaminophen (PERCOCET/ROXICET) 5-325 MG tablet Take 1-2 tablets by mouth every 4 (four) hours as needed for severe pain. 08/17/16   Duffy Bruce, MD  oxyCODONE-acetaminophen (PERCOCET/ROXICET) 5-325 MG tablet Take 1-2 tablets by mouth 2 (two) times daily. 06/16/17   Leandrew Koyanagi, MD  predniSONE (DELTASONE) 20 MG tablet Take 2 tablets (40 mg total) by mouth daily for 5 days. 06/01/18 06/06/18  Jacqlyn Larsen, PA-C  promethazine (PHENERGAN) 25 MG tablet Take 1 tablet (25 mg total) by mouth every 6 (six) hours as needed for nausea. 08/21/16   Leandrew Koyanagi, MD  senna-docusate (SENOKOT S) 8.6-50 MG tablet Take 1 tablet by mouth at bedtime as needed. 08/21/16   Leandrew Koyanagi, MD  SPRINTEC 28 0.25-35 MG-MCG tablet Take 1 tablet by mouth daily.  06/09/16   [provider]    Family History Family History  Problem Relation Age of Onset  . Hypertension Mother   . Diabetes Other   . Hypertension Other   . COPD Other   . Other Other     Social History Social History     Tobacco Use  . Smoking status: Current Every Day Smoker    Packs/day: 1.00    Types: Cigarettes    Last attempt to quit: 08/26/2016    Years since quitting: 1.7  . Smokeless tobacco: Never Used  . Tobacco comment: PT STATES SHE QUIT SMOKING 1 WEEK AGO  Substance Use Topics  . Alcohol use: Yes    Comment: ocassional  . Drug use: No     Allergies   Amoxicillin; Nickel; Penicillins; and Tramadol   Review of Systems Review of Systems  Constitutional: Negative for chills and fever.  HENT: Positive for sore throat. Negative for facial swelling,  trouble swallowing and voice change.   Eyes: Negative for visual disturbance.  Respiratory: Positive for chest tightness and shortness of breath.   Cardiovascular: Negative for chest pain, palpitations and leg swelling.  Gastrointestinal: Positive for nausea. Negative for abdominal pain and vomiting.  Genitourinary: Negative for dysuria and frequency.  Musculoskeletal: Negative for arthralgias and myalgias.  Skin: Positive for rash.  Neurological: Positive for light-headedness. Negative for syncope.     Physical Exam Updated Vital Signs BP 120/75 (BP Location: Right Arm)   Pulse 93   Temp 98.4 F (36.9 C) (Oral)   Resp 16   SpO2 99%   Physical Exam  Constitutional: She is oriented to person, place, and time. She appears well-developed and well-nourished. No distress.  Alert, well-appearing and in no acute distress  HENT:  Head: Normocephalic and atraumatic.  Mouth/Throat: Oropharynx is clear and moist.  No facial swelling noted, no swelling of the tongue or lips, posterior oropharynx is clear and moist without edema.  Tolerating secretions without difficulty, no trismus, normal phonation  Eyes: Pupils are equal, round, and reactive to light. EOM are normal. Right eye exhibits no discharge. Left eye exhibits no discharge.  Neck: Normal range of motion. Neck supple.  No swelling.  Stridor on auscultation  Cardiovascular: Normal  rate, regular rhythm, normal heart sounds and intact distal pulses. Exam reveals no gallop and no friction rub.  No murmur heard. Pulmonary/Chest: Effort normal and breath sounds normal. No respiratory distress.  Respirations equal and unlabored, patient able to speak in full sentences, lungs clear to auscultation bilaterally  Abdominal: Soft. Bowel sounds are normal. She exhibits no distension and no mass. There is no tenderness. There is no guarding.  Respirations equal and unlabored, patient able to speak in full sentences, lungs clear to auscultation bilaterally  Musculoskeletal: She exhibits no edema or deformity.  Neurological: She is alert and oriented to person, place, and time. Coordination normal.  Skin: Skin is warm and dry. Capillary refill takes less than 2 seconds. No rash noted. She is not diaphoretic.  Psychiatric: She has a normal mood and affect. Her behavior is normal.  Nursing note and vitals reviewed.    ED Treatments / Results  Labs (all labs ordered are listed, but only abnormal results are displayed) Labs Reviewed - No data to display  EKG None  Radiology No results found.  Procedures Procedures (including critical care time)  Medications Ordered in ED Medications  sodium chloride 0.9 % bolus 1,000 mL (0 mLs Intravenous Stopped 06/01/18 1912)  methylPREDNISolone sodium succinate (SOLU-MEDROL) 125 mg/2 mL injection 125 mg (125 mg Intravenous Given 06/01/18 1813)  famotidine (PEPCID) IVPB 20 mg premix (0 mg Intravenous Stopped 06/01/18 1902)     Initial Impression / Assessment and Plan / ED Course  I have reviewed the triage vital signs and the nursing notes.  Pertinent labs & imaging results that were available during my care of the patient were reviewed by me and considered in my medical decision making (see chart for details).  Presents with allergic reaction after receiving Septocaine 4% and sodium metabisulfite during a dental procedure today.  Patient  had throat closing sensation, shortness of breath, hives, lightheadedness and was found to be hypotensive when EMS arrived.  Given 1 dose of epinephrine and 50 of IM Benadryl, 4 of Zofran prior to arrival with significant improvement in symptoms.  On my evaluation patient with normal vitals and in no acute distress, no facial swelling or angioedema of the lips or tongue,  posterior oropharynx is clear moist, patient speaking in full sentences, no stridor, lungs are clear to auscultation, rash is resolved, no vomiting.  Will give IV Benadryl, steroids and Pepcid as well as 1 L fluid bolus here and monitor for the next 4 hours for any recurrence of symptoms.  Served in the emergency department for 4 hours with no recurrence of symptoms.  She is stable for discharge at this time, will prescribe steroid burst, patient also provided with prescription for EpiPen.  Return precautions discussed.  Patient follow-up with PCP.  All questions answered.  Able for discharge home at this time.  Patient expresses understanding and is in agreement with plan.  Final Clinical Impressions(s) / ED Diagnoses   Final diagnoses:  Anaphylaxis, initial encounter    ED Discharge Orders         Ordered    predniSONE (DELTASONE) 20 MG tablet  Daily     06/01/18 2200    EPINEPHrine 0.3 mg/0.3 mL IJ SOAJ injection   Once     06/01/18 2200           Jacqlyn Larsen, PA-C 06/01/18 2212    Tegeler, Gwenyth Allegra, MD 06/01/18 2244

## 2018-06-01 NOTE — ED Triage Notes (Signed)
Patient was at dentists office having work done.  Patient was on her way out and began having itching, N & V and hives.  EMS called. Epi 0.3 and benadryl 50 given IM and Zofran 4 mg IV given by EMS. Patient began improving.  Meds given by the dentist during her office visit were Septacaine 4% and Sodium Metabisulfate.

## 2018-06-14 ENCOUNTER — Inpatient Hospital Stay: Payer: BLUE CROSS/BLUE SHIELD

## 2018-06-14 ENCOUNTER — Encounter: Payer: Self-pay | Admitting: Genetic Counselor

## 2018-06-14 ENCOUNTER — Inpatient Hospital Stay: Payer: BLUE CROSS/BLUE SHIELD | Attending: Genetic Counselor | Admitting: Genetic Counselor

## 2018-06-14 DIAGNOSIS — Z8041 Family history of malignant neoplasm of ovary: Secondary | ICD-10-CM | POA: Insufficient documentation

## 2018-06-14 DIAGNOSIS — Z8481 Family history of carrier of genetic disease: Secondary | ICD-10-CM | POA: Diagnosis not present

## 2018-06-14 DIAGNOSIS — Z803 Family history of malignant neoplasm of breast: Secondary | ICD-10-CM

## 2018-06-14 NOTE — Progress Notes (Signed)
REFERRING PROVIDER: Azucena Fallen, MD [address] [place], [place] [address]  PRIMARY PROVIDER:  Patient, No Pcp Per  PRIMARY REASON FOR VISIT:  1. Family history of breast cancer   2. Family history of ovarian cancer   3. Family history of BRCA2 gene positive      HISTORY OF PRESENT ILLNESS:   Rebecca Adkins, a 27 y.o. female, was seen for a [facility] cancer genetics consultation at the request of Dr. Benjie Karvonen due to a family history of cancer.  Rebecca Adkins presents to clinic today to discuss the possibility of a hereditary predisposition to cancer, genetic testing, and to further clarify her future cancer risks, as well as potential cancer risks for family members.   Rebecca Adkins is a 27 y.o. female with no personal history of cancer.  Her sister was recently diagnosed with breast cancer at 9 and was found to have a BRCA2 familial mutation.  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  CANCER HISTORY:   No history exists.     HORMONAL RISK FACTORS:  Menarche was at age 40.  First live birth at age N/A.  OCP use for approximately 5-6 years.  Ovaries intact: yes.  Hysterectomy: no.  Menopausal status: premenopausal.  HRT use: 0 years. Colonoscopy: no; not examined. Mammogram within the last year: no. Number of breast biopsies: 0. Up to date with pelvic exams:  yes. Any excessive radiation exposure in the past:  no  Past Medical History:  Diagnosis Date  . Cancer (HCC)    cervical  . Clavicle fracture    left  . Elevated liver enzymes   . Family history of BRCA2 gene positive   . Family history of breast cancer   . Family history of ovarian cancer   . GERD (gastroesophageal reflux disease)   . Headache   . PONV (postoperative nausea and vomiting)   . Seizures ([place])    as a child only    Past Surgical History:  Procedure Laterality Date  . DILATION AND  CURETTAGE OF UTERUS    . ORIF CLAVICULAR FRACTURE Left 08/21/2016   Procedure: OPEN REDUCTION INTERNAL FIXATION (ORIF) LEFT CLAVICLE FRACTURE;  Surgeon: Leandrew Koyanagi, MD;  Location: [facility];  Service: Orthopedics;  Laterality: Left;  . OVARY SURGERY      Social History   Socioeconomic History  . Marital status: Single    Spouse name: Not on file  . Number of children: Not on file  . Years of education: Not on file  . Highest education level: Not on file  Occupational History  . Not on file  Social Needs  . Financial resource strain: Not on file  . Food insecurity:    Worry: Not on file    Inability: Not on file  . Transportation needs:    Medical: Not on file    Non-medical: Not on file  Tobacco Use  . Smoking status: Current Every Day Smoker    Packs/day: 1.00    Types: Cigarettes    Last attempt to quit: 08/26/2016    Years since quitting: 1.8  . Smokeless tobacco: Never Used  . Tobacco comment: PT STATES SHE QUIT SMOKING 1 WEEK AGO  Substance and Sexual Activity  . Alcohol use: Yes    Comment: ocassional  . Drug use: No  . Sexual activity: Yes    Birth control/protection: Pill  Lifestyle  .  Physical activity:    Days per week: Not on file    Minutes per session: Not on file  . Stress: Not on file  Relationships  . Social connections:    Talks on phone: Not on file    Gets together: Not on file    Attends religious service: Not on file    Active member of club or organization: Not on file    Attends meetings of clubs or organizations: Not on file    Relationship status: Not on file  Other Topics Concern  . Not on file  Social History Narrative  . Not on file     FAMILY HISTORY:  We obtained a detailed, 4-generation family history.  Significant diagnoses are listed below: Family History  Problem Relation Age of Onset  . Hypertension Mother   . Ovarian cancer Mother 68  . Diabetes Other   . Hypertension Other   . COPD Other   . Other Other   . Breast  cancer Sister 82  . BRCA 1/2 Sister        BRCA2 pos  . Colon cancer Paternal Aunt        dx over 12  . Stroke Maternal Grandfather   . COPD Paternal Grandmother   . Other Paternal Uncle        infant death  . Breast cancer Other        PGMs sister  . Breast cancer Other        PGMs sister    The patient does not have children.  She has a full sister who was diagnosed with breast cancer at age 20 and was found to have a BRCA2 mutation.  She also has two paternal half sisters and two paternal half brothers who are cancer free.  Both parents are living.  The patient's mother had ovarian cancer at age 7.  She has never undergone genetic testing.  The patient's mother had tow sisters and a brother who are all cancer free.  Her brother has a daughter who had a GYN cancer at age 49.  The maternal grandmother is living at age 62 and the grandfather died of a stroke.   The patient's father is cancer free.  He has two sisters and three brothers.  One brother died as an infant age 95, another brother died of unknown causes, and one sister has colon cancer in her 47's.  The paternal grandparents are deceased.  The grandmother had two sisters who had breast cancer.  Rebecca Adkins is aware of previous family history of genetic testing for hereditary cancer risks in her sister. Patient's maternal ancestors are of [place] descent, and paternal ancestors are of [place] descent. There is no reported Ashkenazi Jewish ancestry. There is no known consanguinity.  GENETIC COUNSELING ASSESSMENT: Rebecca Adkins is a 27 y.o. 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 We, therefore, discussed and recommended the following at today's visit.   DISCUSSION: We discussed the family history of breast and ovarian cancer, and the  familial BRCA2 mutation.  About 5-10% of breast cancer, and up to 20% of ovarian cancer is due to hereditary causes, most commonly BRCA mutations.  Her sister tested positive for a BRCA2 mutation, which puts her at a 50% chance of also having this mutation, and her 1/2 siblings  at a 25% chance.  Rebecca Adkins reports that her parents have not undergone genetic testing for the BRCA2 mutation.  We disucssed that this would be important in order to confirm which side of the family this could be coming from. We assume that it is coming from her mother, who had ovarian cancer.  However, her father has a family history of breast cancer.  Finding this information out would be important in order to perform cascade testing.  Rebecca Adkins underwent genetic testing to see if she has the BRCA2 mutation.  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  NTHL1 is a gene that is associated with autosomal recessive polyposis colon cancer syndromes.  This is a newer gene that is tested, and therefore information is still emerging.  At this time, literature does not suggest that having one mutation, as Rebecca Adkins does, increases the risk for colon cancer.  However, if Rebecca Adkins has children with someone who also has a NTHL1 mutation, then there is a 25% chance she could have a child who would have NTHL1-associated polyposis.  Currently, NCCN guidelines has recommendations for individuals who have biallelic mutations, but not heterozygous mutations.    Genetic testing did detect a Variant of Unknown Significance in the RECQL4 gene called c.1852C>T. At this time, it is unknown if this variant is associated with increased cancer risk or if this is a normal finding, but most variants such as this get reclassified to being inconsequential. It should not be used to make medical management decisions. With time, we suspect the lab will determine the significance of this variant, if  any. If we do learn more about it, we will try to contact Rebecca Adkins to discuss it further. However, it is important to stay in touch with [place] periodically and keep the address and phone number up to date.  Based on the patient's personal and family history, statistical models (Tyrer Cusik)  and literature data were used to estimate her risk of developing breast cancer. These estimate her lifetime risk of developing breast cancer to be approximately 23.7%. This estimation does not take into account any genetic testing results.  The patient's lifetime breast cancer risk is a preliminary estimate based on available information using one of several models endorsed by the [place] (ACS). The ACS recommends consideration of breast MRI screening as an adjunct to mammography for patients at high risk (defined as 20% or greater lifetime risk). A more detailed breast cancer risk assessment can be considered, if clinically indicated.   Rebecca Adkins has been determined to be at high risk for breast cancer.  Therefore, we recommend that annual screening with mammography and breast MRI begin at age 22, or 10 years prior to the age of breast cancer diagnosis in a relative (whichever is earlier). We recommend that she consider being followed through the [facility].  We will send a referral to the clinic and someone should contact her within the week and set up an appointment for this clinic.     FAMILY MEMBERS: It is important that all of Rebecca Adkins's relatives (both men and women) know of the presence of this gene mutation. Site-specific genetic testing can sort out who in the family is at risk and who is not.   Rebecca Adkins full sister has a 50% chance to have inherited this mutation, and her half siblings have a 25% chance to have this mutation.  These  risks are similar for the BRCA2 mutation identified in Rebecca Adkins full sister. We recommend they have genetic testing for this same mutation,  as identifying the presence of this mutation would allow them to also take advantage of risk-reducing measures.   PLAN: Rebecca Adkins will be [facility] Breast clinic.  She will be contacted within the week to set up an appointment.  We encouraged Rebecca Adkins to remain in contact with cancer genetics annually so that we can continuously update the family history and inform her of any changes in cancer genetics and testing that may be of benefit for this family.   Ms.  Adkins questions were answered to her satisfaction today. Our contact information was provided should additional questions or concerns arise. Thank you for the referral and allowing [place] to share in the care of your patient.   Khiry Pasquariello P. Florene Glen, [place], [facility] Certified Genetic Counselor [email or web address] phone: [phone]  The patient was seen for a total of 45 minutes in face-to-face genetic counseling.  This patient was discussed with Drs. Magrinat, Lindi Adie and/or Burr Medico who agrees with the above.    _______________________________________________________________________ For Office Staff:  Number of people involved in session: 1 Was an Intern/ student involved with case: no

## 2018-07-09 ENCOUNTER — Telehealth: Payer: Self-pay | Admitting: Genetic Counselor

## 2018-07-09 NOTE — Telephone Encounter (Signed)
Lft the pt a msg with her mom to cb to schedule an appt

## 2018-07-12 ENCOUNTER — Encounter: Payer: Self-pay | Admitting: Oncology

## 2018-07-12 ENCOUNTER — Telehealth: Payer: Self-pay | Admitting: Oncology

## 2018-07-12 NOTE — Telephone Encounter (Signed)
Pt returned my call to schedule a high risk clinic appt. Pt has been scheduled to see Dr. Jana Hakim on 11/4 at 4pm. Letter mailed to the pt.

## 2018-07-23 NOTE — Progress Notes (Signed)
[place]  [phone] [phone]     ID: Rebecca Adkins DOB: 10/27/1990  MR#: [id number]  EXN#:[id number]  Patient Care Team: Patient, No Pcp Per as PCP - General (General Practice) Pearse Shiffler, Virgie Dad, MD as Consulting Physician (Oncology) Leandrew Koyanagi, MD as Attending Physician (Orthopedic Surgery) Azucena Fallen, MD as Consulting Physician (Obstetrics and Gynecology) OTHER MD:  CHIEF COMPLAINT: Breast cancer high risk  CURRENT TREATMENT: Observation   HISTORY OF CURRENT ILLNESS: Rebecca Adkins has a significant family history of cancer, as detailed below, and was referred to our genetics counselors on 06/14/2018 for further evaluation.  More specifically the patient's sister developed breast cancer at age 40 and was found to have a BRCA2 familial mutation.  Rebecca Adkins has been genetically tested and found to not carry the BRCA2 mutation.  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    INTERVAL HISTORY: Rebecca Adkins was evaluated in the high risk cancer clinic on 07/26/2018.    REVIEW OF SYSTEMS: Rebecca Adkins reports that for exercise, she mainly walks.  Her work is sedentary however.  She has had multiple surgeries in the last 2 years due to broken collarbone. She had cervical dysplasia. The patient denies unusual headaches, visual changes, nausea, vomiting, stiff neck, dizziness, or gait imbalance. There has been no cough, phlegm production, or pleurisy, no chest pain or pressure, and no change in bowel or bladder habits. The patient denies fever, rash, bleeding, unexplained fatigue or unexplained weight loss. A detailed review of systems was otherwise entirely negative.   PAST MEDICAL HISTORY: Past Medical History:  Diagnosis Date  . Cancer (HCC)    cervical  . Clavicle fracture    left  . Elevated liver  enzymes   . Family history of BRCA2 gene positive   . Family history of breast cancer   . Family history of ovarian cancer   . GERD (gastroesophageal reflux disease)   . Headache   . PONV (postoperative nausea and vomiting)   . Seizures ([place])    as a child only    PAST SURGICAL HISTORY: Past Surgical History:  Procedure Laterality Date  . DILATION AND CURETTAGE OF UTERUS    . ORIF CLAVICULAR FRACTURE Left 08/21/2016   Procedure: OPEN REDUCTION INTERNAL FIXATION (ORIF) LEFT CLAVICLE FRACTURE;  Surgeon: Leandrew Koyanagi, MD;  Location: [place];  Service: Orthopedics;  Laterality: Left;  . OVARY SURGERY      FAMILY HISTORY Family History  Problem Relation Age of Onset  . Hypertension Mother   . Ovarian cancer Mother 62  . Diabetes Other   . Hypertension Other   . COPD Other   . Other Other   . Breast cancer Sister 34  . BRCA 1/2 Sister        BRCA2 pos  . Colon cancer Paternal Aunt        dx over 19  . Stroke Maternal Grandfather   . COPD Paternal Grandmother   . Other Paternal Uncle        infant death  . Breast cancer Other        PGMs sister  . Breast cancer Other        PGMs sister  As of November 2019 the patient has a full sister diagnosed with breast cancer at age 21 with a BRCA2 mutation in place.  The  patient has 2 paternal half-sisters and 2 paternal half-brothers were cancer free.  The patient's parents are living, but the patient's mother did have ovarian cancer at age 57.  The patient's father is 14 years old.  Mother is 43.  She has never been tested.  The patient's mother had 2 sisters and a brother.  They are all cancer free.  There is in addition mother's brother's daughter with gynecologic cancer at age 73, but is uncertain of which cancer.  The maternal grandmother is passed away at age 81, 07/11/2018.  The maternal grandfather died of a stroke. On the father's side he has 2 sisters and 3 brothers.  One brother died as an infant, age 55, and another died of unknown  causes.  One sister developed colon cancer in her 71s.  The paternal grandmother had 2 sisters who had breast cancer. The patient is of [place] descent and paternal ancestors are of [place] descent.  There is no reported Ashkenazi Jewish ancestry or consanguinity.    GYNECOLOGIC HISTORY:  No LMP recorded. 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  Lives with boyfriend [name], who works in Architect.  He has a 30 yr old adopted daughter who lives independently     ADVANCED DIRECTIVES: Not in place   HEALTH MAINTENANCE: Social History   Tobacco Use  . Smoking status: Current Every Day Smoker    Packs/day: 1.00    Types: Cigarettes    Last attempt to quit: 08/26/2016    Years since quitting: 1.9  . Smokeless tobacco: Never Used  . Tobacco comment: PT STATES SHE QUIT SMOKING 1 WEEK AGO  Substance Use Topics  . Alcohol use: Yes    Comment: ocassional  . Drug use: No     Colonoscopy: n/a  PAP: April 2019  Bone density:    Allergies  Allergen Reactions  . Articaine Anaphylaxis  . Sodium Bisulfate Anaphylaxis  . Amoxicillin Hives     " Hives and rash "  . Nickel Hives    " hives and a rash"  . Penicillins     Has patient had a PCN reaction causing immediate rash, facial/tongue/throat swelling, SOB or lightheadedness with hypotension: Yes Has patient had a PCN reaction causing severe rash involving mucus membranes or skin necrosis: No Has patient had a PCN reaction that required hospitalization No Has patient had a PCN reaction occurring within the last 10 years: No If all of the above answers are "NO", then may proceed with Cephalosporin use.   . Tramadol     Current Outpatient Medications  Medication  Sig Dispense Refill  . famotidine (PEPCID) 20 MG tablet Take 1 tablet (20 mg total) by mouth 2 (two) times daily. 30 tablet 0  . ibuprofen (ADVIL,MOTRIN) 200 MG tablet Take 800 mg by mouth every 6 (six) hours as needed for mild pain.    Marland Kitchen diclofenac sodium (VOLTAREN) 1 % GEL Apply 2 g topically 4 (four) times daily. (Patient not taking: Reported on 07/26/2018) 1 Tube 5  . Docusate Calcium (STOOL SOFTENER PO) Take 1 tablet by mouth daily as needed (constipation).    Marland Kitchen EPINEPHrine 0.3 mg/0.3 mL IJ SOAJ injection     . ondansetron (  ZOFRAN) 4 MG tablet Take 1-2 tablets (4-8 mg total) by mouth every 8 (eight) hours as needed for nausea or vomiting. (Patient not taking: Reported on 07/26/2018) 40 tablet 0  . senna-docusate (SENOKOT S) 8.6-50 MG tablet Take 1 tablet by mouth at bedtime as needed. (Patient not taking: Reported on 07/26/2018) 30 tablet 1   No current facility-administered medications for this visit.     OBJECTIVE: Young white woman in no acute distress  Vitals:   07/26/18 1555  BP: 131/79  Pulse: 97  Resp: 18  Temp: 97.9 F (36.6 C)  SpO2: 100%     Body mass index is 34.26 kg/m.   Wt Readings from Last 3 Encounters:  07/26/18 225 lb 4.8 oz (102.2 kg)  06/01/18 212 lb (96.2 kg)  08/21/16 200 lb (90.7 kg)      ECOG FS:0 - Asymptomatic  Ocular: Sclerae unicteric, pupils round and equal Lymphatic: No cervical or supraclavicular adenopathy Lungs no rales or rhonchi Heart regular rate and rhythm Abd soft, nontender, positive bowel sounds MSK no focal spinal tenderness, no joint edema Neuro: non-focal, well-oriented, appropriate affect Breasts: No masses palpated in either breast.  No skin or nipple changes of concern.  Both axillae are benign.   LAB RESULTS:  CMP  No results found for: NA, K, CL, CO2, GLUCOSE, BUN, CREATININE, CALCIUM, PROT, ALBUMIN, AST, ALT, ALKPHOS, BILITOT, GFRNONAA, GFRAA  No results found for: TOTALPROTELP, ALBUMINELP, A1GS, A2GS, BETS, BETA2SER,  GAMS, MSPIKE, SPEI  No results found for: KPAFRELGTCHN, LAMBDASER, KAPLAMBRATIO  Lab Results  Component Value Date   WBC 7.8 08/21/2016   HGB 12.9 08/21/2016   HCT 38.1 08/21/2016   MCV 94.1 08/21/2016   PLT 274 08/21/2016    _0 @  No results found for: LABCA2  No components found for: [id number]  No results for input(s): INR in the last 168 hours.  No results found for: LABCA2  No results found for: VFI433  No results found for: IRJ188  No results found for: CZY606  No results found for: CA2729  No components found for: HGQUANT  No results found for: CEA1 / No results found for: CEA1   No results found for: AFPTUMOR  No results found for: CHROMOGRNA  No results found for: PSA1  No visits with results within 3 Day(s) from this visit.  Latest known visit with results is:  Admission on 08/21/2016, Discharged on 08/21/2016  Component Date Value Ref Range Status  . WBC 08/21/2016 7.8  4.0 - 10.5 K/uL Final  . RBC 08/21/2016 4.05  3.87 - 5.11 MIL/uL Final  . Hemoglobin 08/21/2016 12.9  12.0 - 15.0 g/dL Final  . HCT 08/21/2016 38.1  36.0 - 46.0 % Final  . MCV 08/21/2016 94.1  78.0 - 100.0 fL Final  . MCH 08/21/2016 31.9  26.0 - 34.0 pg Final  . MCHC 08/21/2016 33.9  30.0 - 36.0 g/dL Final  . RDW 08/21/2016 12.8  11.5 - 15.5 % Final  . Platelets 08/21/2016 274  150 - 400 K/uL Final    (this displays the last labs from the last 3 days)  No results found for: TOTALPROTELP, ALBUMINELP, A1GS, A2GS, BETS, BETA2SER, GAMS, MSPIKE, SPEI (this displays SPEP labs)  No results found for: KPAFRELGTCHN, LAMBDASER, KAPLAMBRATIO (kappa/lambda light chains)  No results found for: HGBA, HGBA2QUANT, HGBFQUANT, HGBSQUAN (Hemoglobinopathy evaluation)   No results found for: LDH  No results found for: IRON, TIBC, IRONPCTSAT (Iron and TIBC)  No results found for: FERRITIN  Urinalysis No results  found for: COLORURINE, APPEARANCEUR, LABSPEC, PHURINE, GLUCOSEU,  HGBUR, BILIRUBINUR, KETONESUR, PROTEINUR, UROBILINOGEN, NITRITE, LEUKOCYTESUR   STUDIES:  No results found.  ELIGIBLE FOR AVAILABLE RESEARCH PROTOCOL: no  ASSESSMENT: 27 y.o. climax [place] woman with a significant family history for breast and ovarian cancer, genetics testing showing a pathogenic mutation in [place] c.268C>T  (a) variant of uncertain significance was detected in RECQL4 c.1852C>T  (b) no BRCA mutation  (1) breast cancer lifetime risk per [facility] model is 23.7% (double normal population risk)  (2) risk reduction strategies: opts against at this time  (3) intensified screening strategies: opts against at this time  PLAN: I spent approximately 60 minutes face to face with Rebecca Adkins with more than 50% of that time spent in counseling and coordination of care. Specifically we reviewed the biology of the patient's diagnosis and the specifics of her situation.  She understands that while there is a significant gene abnormality in the family, namely her sister's BRCA mutation, she herself does not carry that.  On the other hand she does carry a mutation, NTH L1, which when homozygous has been associated with high risk of colon, not breast cancer.  However she has a heterozygous mutation and therefore is not clearly at increased colon cancer risk.  In other words while there are documented mutations in the family and in her own case they are not relevant to today's discussion.  What does raise her risk of breast cancer is the family history.  Her lifetime risk of breast cancer is approximately double the normal population, or about 25%.  There are 2 basic strategies to reduce this risk.  1 of them is intensified screening.  She is too young for mammography.  We discussed the possibility of yearly MRI, and concerns regarding cost and false positives.  At this point she does not want to proceed with that strategy.  The second strategy is risk reduction.  One possibility there  is bilateral mastectomies.  We discussed the fact that there are many different types of mastectomies and many different reconstruction options.  She thinks this is very radical and does not want to pursue that option and I agree.  She could on the other hand take antiestrogens for 5 years.  That would cut her risk in half which would essentially bring it down to that of the normal population.  Specifically we discussed tamoxifen and the use of a Mirena IUD for contraception.  We went over the possible toxicity side effects and complications of this combination.  After much discussion she really does not want to take tamoxifen or have an IUD placed.  She tells me that she is comfortable living with her risk of breast cancer such as it is.  We discussed breast self-exam and she was encouraged to do it on a monthly basis, always at the same time in her menstrual cycle and immediately bring to her gynecologist attention any change that she notices.  Of course she will have breast exams by her gynecologist as well.  I will be glad to meet with her in the future if she wishes to pursue any other strategies discussed above at this point however we are not making a routine return appointment for her here.   Rebecca Adkins has a good understanding of the overall plan. She agrees with it. She knows the goal of treatment in her case is cure. She will call with any problems that may develop before her next visit here.  Rebecca Adkins, Virgie Dad,  MD  07/26/18 5:12 PM Medical Oncology and Hematology [facility] [address] [place], [place] [address] Tel. [phone]    Fax. [phone]    I, Samul Dada am acting as scribe for Dr. Virgie Dad. Rebecca Adkins.  I, Lurline Del MD, have reviewed the above documentation for accuracy and completeness, and I agree with the above.   Addendum: From the genetics counseling note 06/14/2018,  "NTHL1 is a gene that is associated with autosomal recessive  polyposis colon cancer syndromes.  This is a newer gene that is tested, and therefore information is still emerging.  At this time, literature does not suggest that having one mutation, as Rebecca Adkins does, increases the risk for colon cancer.  However, if Rebecca Adkins has children with someone who also has a NTHL1 mutation, then there is a 25% chance she could have a child who would have NTHL1-associated polyposis.  Currently, NCCN guidelines has recommendations for individuals who have biallelic mutations, but not heterozygous mutations.    Genetic testing did detect a Variant of Unknown Significance in the RECQL4 gene called c.1852C>T. At this time, it is unknown if this variant is associated with increased cancer risk or if this is a normal finding, but most variants such as this get reclassified to being inconsequential. It should not be used to make medical management decisions. With time, we suspect the lab will determine the significance of this variant, if any. If we do learn more about it, we will try to contact Rebecca Adkins to discuss it further. However, it is important to stay in touch with [place] periodically and keep the address and phone number up to date.  "

## 2018-07-26 ENCOUNTER — Encounter: Payer: Self-pay | Admitting: Oncology

## 2018-07-26 ENCOUNTER — Inpatient Hospital Stay: Payer: BLUE CROSS/BLUE SHIELD | Attending: Genetic Counselor | Admitting: Oncology

## 2018-07-26 DIAGNOSIS — Z803 Family history of malignant neoplasm of breast: Secondary | ICD-10-CM | POA: Insufficient documentation

## 2018-07-26 DIAGNOSIS — F17200 Nicotine dependence, unspecified, uncomplicated: Secondary | ICD-10-CM | POA: Insufficient documentation

## 2018-07-26 DIAGNOSIS — Z79899 Other long term (current) drug therapy: Secondary | ICD-10-CM | POA: Insufficient documentation

## 2018-07-26 DIAGNOSIS — Z8041 Family history of malignant neoplasm of ovary: Secondary | ICD-10-CM | POA: Diagnosis not present

## 2018-07-26 DIAGNOSIS — Z1501 Genetic susceptibility to malignant neoplasm of breast: Secondary | ICD-10-CM | POA: Insufficient documentation

## 2018-07-26 DIAGNOSIS — Z9189 Other specified personal risk factors, not elsewhere classified: Secondary | ICD-10-CM

## 2018-07-27 ENCOUNTER — Telehealth: Payer: Self-pay | Admitting: Oncology

## 2018-07-27 NOTE — Telephone Encounter (Signed)
No los per 11/4 °

## 2018-07-27 NOTE — Telephone Encounter (Signed)
No 11/4 los orders.

## 2018-08-04 DIAGNOSIS — M79671 Pain in right foot: Secondary | ICD-10-CM | POA: Diagnosis not present

## 2018-08-04 DIAGNOSIS — S92411K Displaced fracture of proximal phalanx of right great toe, subsequent encounter for fracture with nonunion: Secondary | ICD-10-CM | POA: Diagnosis not present

## 2018-08-13 DIAGNOSIS — F3289 Other specified depressive episodes: Secondary | ICD-10-CM | POA: Diagnosis not present

## 2018-08-24 DIAGNOSIS — N926 Irregular menstruation, unspecified: Secondary | ICD-10-CM | POA: Diagnosis not present

## 2018-08-24 DIAGNOSIS — R87619 Unspecified abnormal cytological findings in specimens from cervix uteri: Secondary | ICD-10-CM | POA: Diagnosis not present

## 2018-08-24 DIAGNOSIS — N941 Unspecified dyspareunia: Secondary | ICD-10-CM | POA: Diagnosis not present

## 2018-08-26 DIAGNOSIS — S92411K Displaced fracture of proximal phalanx of right great toe, subsequent encounter for fracture with nonunion: Secondary | ICD-10-CM | POA: Diagnosis not present

## 2018-08-26 DIAGNOSIS — F3289 Other specified depressive episodes: Secondary | ICD-10-CM | POA: Diagnosis not present

## 2018-08-31 DIAGNOSIS — E229 Hyperfunction of pituitary gland, unspecified: Secondary | ICD-10-CM | POA: Diagnosis not present

## 2018-09-09 DIAGNOSIS — F3289 Other specified depressive episodes: Secondary | ICD-10-CM | POA: Diagnosis not present

## 2018-10-04 NOTE — Progress Notes (Signed)
Subjective:    Patient ID: Rebecca Adkins, female    DOB: [birth year], 28 y.o.   MRN: [id number]  HPI:  [name] is here to establish as a new pt.  She is a pleasant 28 year old female. 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, she has been unable to run or exercise vigorously  She previously participated in roller durby 6 days/week She smokes pack/day and has been eating a diet high in saturated fat/CHO She drink 5-10 cocktails over the weekend She has been on several oral medications for weight loss, with the most success with phentermine She denies CP/dyspnea/dizziness/palpitations/insomnia She declined smoking cessation today Discussed at length the smoking impedes healing  Patient Care Team    Relationship Specialty Notifications Start End  Esaw Grandchild, NP PCP - General Family Medicine  10/05/18   Magrinat, Virgie Dad, MD Consulting Physician Oncology  07/26/18   Leandrew Koyanagi, MD Attending Physician Orthopedic Surgery  07/26/18   Azucena Fallen, MD Consulting Physician Obstetrics and Gynecology  07/26/18   Sharlotte Alamo, DPM  Podiatry  10/05/18     Patient Active Problem List   Diagnosis Date Noted  . BMI 34.0-34.9,adult 10/05/2018  . Healthcare maintenance 10/05/2018  . At high risk for breast cancer 07/26/2018  . Family history of breast cancer   . Family history of ovarian cancer   . Family history of BRCA2 gene positive   . Closed displaced fracture of left clavicle 08/19/2016     Past Medical History:  Diagnosis Date  . Cancer (HCC)    cervical  . Clavicle fracture    left  . Elevated liver enzymes   . Family  history of BRCA2 gene positive   . Family history of breast cancer   . Family history of ovarian cancer   . GERD (gastroesophageal reflux disease)   . Headache   . Ovarian cyst   . PONV (postoperative nausea and vomiting)   . Seizures ([place])    as a child only     Past Surgical History:  Procedure Laterality Date  . BUNIONECTOMY    . DILATION AND CURETTAGE OF UTERUS    . HARDWARE REMOVAL     left clavicle  . LEEP    . ORIF CLAVICULAR FRACTURE Left 08/21/2016   Procedure: OPEN REDUCTION INTERNAL FIXATION (ORIF) LEFT CLAVICLE FRACTURE;  Surgeon: Leandrew Koyanagi, MD;  Location: [place];  Service: Orthopedics;  Laterality: Left;  . OVARY SURGERY       Family History  Problem Relation Age of Onset  . Hypertension Mother   . Ovarian cancer Mother 24  . Diabetes Other   . Hypertension Other   . COPD Other   . Other Other   . Breast cancer Sister 21  . BRCA 1/2 Sister        BRCA2 pos  . Hypertension Sister   . Colon cancer Paternal Aunt        dx over 24  . Hypertension Maternal Grandmother   . Diabetes Maternal Grandmother   . Kidney disease Maternal Grandmother   .  Stroke Maternal Grandfather   . COPD Paternal Grandmother   . Other Paternal Uncle        infant death  . Breast cancer Other        PGMs sister  . Breast cancer Other        PGMs sister     Social History   Substance and Sexual Activity  Drug Use No     Social History   Substance and Sexual Activity  Alcohol Use Yes  . Alcohol/week: 13.0 standard drinks  . Types: 3 Glasses of wine, 10 Cans of beer per week     Social History   Tobacco Use  Smoking Status Current Every Day Smoker  . Packs/day: 1.00  . Years: 13.00  . Pack years: 13.00  . Types: Cigarettes  Smokeless Tobacco Never Used  Tobacco Comment   PT STATES SHE QUIT SMOKING 1 WEEK AGO     Outpatient Encounter Medications as of 10/05/2018  Medication Sig Note  . EPINEPHrine 0.3 mg/0.3 mL IJ SOAJ injection    .  norethindrone-ethinyl estradiol-iron (BLISOVI FE 1.5/30) 1.5-30 MG-MCG tablet Take 1 tablet by mouth daily.   . Phentermine HCl 37.5 MG TBDP Take 1 tablet by mouth daily.   . [DISCONTINUED] diclofenac sodium (VOLTAREN) 1 % GEL Apply 2 g topically 4 (four) times daily. (Patient not taking: Reported on 07/26/2018) 07/26/2018: Last dose months ago.  . [DISCONTINUED] Docusate Calcium (STOOL SOFTENER PO) Take 1 tablet by mouth daily as needed (constipation).   . [DISCONTINUED] famotidine (PEPCID) 20 MG tablet Take 1 tablet (20 mg total) by mouth 2 (two) times daily.   . [DISCONTINUED] ibuprofen (ADVIL,MOTRIN) 200 MG tablet Take 800 mg by mouth every 6 (six) hours as needed for mild pain.   . [DISCONTINUED] norethindrone-ethinyl estradiol-iron (MICROGESTIN FE,GILDESS FE,LOESTRIN FE) 1.5-30 MG-MCG tablet Take 1 tablet by mouth daily.   . [DISCONTINUED] ondansetron (ZOFRAN) 4 MG tablet Take 1-2 tablets (4-8 mg total) by mouth every 8 (eight) hours as needed for nausea or vomiting. (Patient not taking: Reported on 07/26/2018) 07/26/2018: Has med ,but not needed  . [DISCONTINUED] senna-docusate (SENOKOT S) 8.6-50 MG tablet Take 1 tablet by mouth at bedtime as needed. (Patient not taking: Reported on 07/26/2018) 07/26/2018: Not needed   No facility-administered encounter medications on file as of 10/05/2018.     Allergies: Articaine; Sodium bisulfate; Amoxicillin; Nickel; Penicillins; and Tramadol  Body mass index is 34.32 kg/m.  Blood pressure 109/73, pulse 81, temperature 98.9 F (37.2 C), temperature source Oral, height '5\' 8"'$  (1.727 m), weight 225 lb 11.2 oz (102.4 kg), last menstrual period 08/08/2018, SpO2 98 %.  Review of Systems  Constitutional: Positive for fatigue. Negative for activity change, appetite change, chills, diaphoresis, fever and unexpected weight change.  Eyes: Negative for visual disturbance.  Respiratory: Negative for cough, chest tightness, shortness of breath, wheezing and stridor.    Cardiovascular: Negative for chest pain, palpitations and leg swelling.  Gastrointestinal: Negative for abdominal distention, abdominal pain, blood in stool, constipation, diarrhea, nausea, rectal pain and vomiting.  Endocrine: Negative for cold intolerance, heat intolerance, polydipsia, polyphagia and polyuria.  Genitourinary: Negative for difficulty urinating and flank pain.  Musculoskeletal: Positive for arthralgias, gait problem and myalgias. Negative for back pain, joint swelling, neck pain and neck stiffness.  Neurological: Negative for dizziness and headaches.  Hematological: Does not bruise/bleed easily.  Psychiatric/Behavioral: Negative for agitation, confusion, decreased concentration, dysphoric mood, hallucinations, self-injury, sleep disturbance and suicidal ideas. The patient is not nervous/anxious and is not  hyperactive.        Objective:   Physical Exam Vitals signs and nursing note reviewed.  Constitutional:      General: She is not in acute distress.    Appearance: She is obese. She is not ill-appearing, toxic-appearing or diaphoretic.  HENT:     Head: Normocephalic and atraumatic.  Cardiovascular:     Rate and Rhythm: Normal rate.     Pulses: Normal pulses.     Heart sounds: Normal heart sounds. No murmur. No friction rub. No gallop.   Pulmonary:     Effort: Pulmonary effort is normal. No respiratory distress.     Breath sounds: Normal breath sounds. No wheezing or rhonchi.  Chest:     Chest wall: No tenderness.  Skin:    Capillary Refill: Capillary refill takes less than 2 seconds.  Neurological:     Mental Status: She is alert and oriented to person, place, and time.  Psychiatric:        Mood and Affect: Mood normal.        Behavior: Behavior normal.        Thought Content: Thought content normal.        Judgment: Judgment normal.       Assessment & Plan:   1. Healthcare maintenance   2. BMI 34.0-34.9,adult    BMI 34.0-34.9,adult [place]  Controlled Substance Database reviewed- last phentermine rx was June 2019 Last narcotic Sept 2019- 3 day supply of oxycodone Explained one month will be provided, follow-up in 3-4 weeks. 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 Current wt 225 Goal wt 180   Healthcare maintenance Please start Phentermine once daily, each morning. Continue to drink plenty of water and follow Mediterranean diet. Increase regular exercise as tolerated- stationary bike and swimming [facility] Open swim). Follow-up in 3-4 weeks  FOLLOW-UP:  Return in about 4 weeks (around 11/02/2018) for Regular Follow Up, Obesity, Evaluate Medication Effectiveness, Medical Weight Loss.

## 2018-10-05 ENCOUNTER — Ambulatory Visit: Payer: BLUE CROSS/BLUE SHIELD | Admitting: Adult Health

## 2018-10-05 ENCOUNTER — Encounter: Payer: Self-pay | Admitting: Adult Health

## 2018-10-05 VITALS — BP 109/73 | HR 81 | Temp 98.9°F | Ht 68.0 in | Wt 225.7 lb

## 2018-10-05 DIAGNOSIS — Z0000 Encounter for general adult medical examination without abnormal findings: Secondary | ICD-10-CM | POA: Diagnosis not present

## 2018-10-05 DIAGNOSIS — Z6834 Body mass index (BMI) 34.0-34.9, adult: Secondary | ICD-10-CM | POA: Diagnosis not present

## 2018-10-05 DIAGNOSIS — Z6833 Body mass index (BMI) 33.0-33.9, adult: Secondary | ICD-10-CM | POA: Insufficient documentation

## 2018-10-05 MED ORDER — PHENTERMINE HCL 37.5 MG PO TBDP: 1.0000 | ORAL_TABLET | Freq: Every day | ORAL | 0 refills | Status: DC

## 2018-10-05 NOTE — Assessment & Plan Note (Signed)
Please start Phentermine once daily, each morning. Continue to drink plenty of water and follow Mediterranean diet. Increase regular exercise as tolerated- stationary bike and swimming [facility] Open swim). Follow-up in 3-4 weeks

## 2018-10-05 NOTE — Patient Instructions (Signed)
Mediterranean Diet A Mediterranean diet refers to food and lifestyle choices that are based on the traditions of countries located on the [employer]. This way of eating has been shown to help prevent certain conditions and improve outcomes for people who have chronic diseases, like kidney disease and heart disease. What are tips for following this plan? Lifestyle  Cook and eat meals together with your family, when possible.  Drink enough fluid to keep your urine clear or pale yellow.  Be physically active every day. This includes: ? Aerobic exercise like running or swimming. ? Leisure activities like gardening, walking, or housework.  Get 7-8 hours of sleep each night.  If recommended by your health care provider, drink red wine in moderation. This means 1 glass a day for nonpregnant women and 2 glasses a day for men. A glass of wine equals 5 oz (150 mL). Reading food labels   Check the serving size of packaged foods. For foods such as rice and pasta, the serving size refers to the amount of cooked product, not dry.  Check the total fat in packaged foods. Avoid foods that have saturated fat or trans fats.  Check the ingredients list for added sugars, such as corn syrup. Shopping  At the grocery store, buy most of your food from the areas near the walls of the store. This includes: ? Fresh fruits and vegetables (produce). ? Grains, beans, nuts, and seeds. Some of these may be available in unpackaged forms or large amounts (in bulk). ? Fresh seafood. ? Poultry and eggs. ? Low-fat dairy products.  Buy whole ingredients instead of prepackaged foods.  Buy fresh fruits and vegetables in-season from local farmers markets.  Buy frozen fruits and vegetables in resealable bags.  If you do not have access to quality fresh seafood, buy precooked frozen shrimp or canned fish, such as tuna, salmon, or sardines.  Buy small amounts of raw or cooked vegetables, salads, or olives from  the deli or salad bar at your store.  Stock your pantry so you always have certain foods on hand, such as olive oil, canned tuna, canned tomatoes, rice, pasta, and beans. Cooking  Cook foods with extra-virgin olive oil instead of using butter or other vegetable oils.  Have meat as a side dish, and have vegetables or grains as your main dish. This means having meat in small portions or adding small amounts of meat to foods like pasta or stew.  Use beans or vegetables instead of meat in common dishes like chili or lasagna.  Experiment with different cooking methods. Try roasting or broiling vegetables instead of steaming or sauteing them.  Add frozen vegetables to soups, stews, pasta, or rice.  Add nuts or seeds for added healthy fat at each meal. You can add these to yogurt, salads, or vegetable dishes.  Marinate fish or vegetables using olive oil, lemon juice, garlic, and fresh herbs. Meal planning   Plan to eat 1 vegetarian meal one day each week. Try to work up to 2 vegetarian meals, if possible.  Eat seafood 2 or more times a week.  Have healthy snacks readily available, such as: ? Vegetable sticks with hummus. ? [place] yogurt. ? Fruit and nut trail mix.  Eat balanced meals throughout the week. This includes: ? Fruit: 2-3 servings a day ? Vegetables: 4-5 servings a day ? Low-fat dairy: 2 servings a day ? Fish, poultry, or lean meat: 1 serving a day ? Beans and legumes: 2 or more servings a week ?  Nuts and seeds: 1-2 servings a day ? Whole grains: 6-8 servings a day ? Extra-virgin olive oil: 3-4 servings a day  Limit red meat and sweets to only a few servings a month What are my food choices?  Mediterranean diet ? Recommended ? Grains: Whole-grain pasta. Brown rice. Bulgar wheat. Polenta. Couscous. Whole-wheat bread. [place]. ? Vegetables: Artichokes. Beets. Broccoli. Cabbage. Carrots. Eggplant. Green beans. Chard. Kale. Spinach. Onions. Leeks. Peas. Squash.  Tomatoes. Peppers. Radishes. ? Fruits: Apples. Apricots. Avocado. Berries. Bananas. Cherries. Dates. Figs. Grapes. Lemons. Melon. Oranges. Peaches. Plums. Pomegranate. ? Meats and other protein foods: Beans. Almonds. Sunflower seeds. Pine nuts. Peanuts. [place]. Salmon. Scallops. Shrimp. [place]. Tilapia. Clams. Oysters. Eggs. ? Dairy: Low-fat milk. Cheese. Greek yogurt. ? Beverages: Water. Red wine. Herbal tea. ? Fats and oils: Extra virgin olive oil. Avocado oil. Grape seed oil. ? Sweets and desserts: [place] yogurt with honey. Baked apples. Poached pears. Trail mix. ? Seasoning and other foods: Basil. Cilantro. Coriander. Cumin. Mint. Parsley. Sage. Rosemary. Tarragon. Garlic. Oregano. Thyme. Pepper. Balsalmic vinegar. Tahini. Hummus. Tomato sauce. Olives. Mushrooms. ? Limit these ? Grains: Prepackaged pasta or rice dishes. Prepackaged cereal with added sugar. ? Vegetables: Deep fried potatoes (french fries). ? Fruits: Fruit canned in syrup. ? Meats and other protein foods: Beef. Pork. Lamb. Poultry with skin. Hot dogs. [place] Salines. ? Dairy: Ice cream. Sour cream. Whole milk. ? Beverages: Juice. Sugar-sweetened soft drinks. Beer. Liquor and spirits. ? Fats and oils: Butter. Canola oil. Vegetable oil. Beef fat (tallow). Lard. ? Sweets and desserts: Cookies. Cakes. Pies. Candy. ? Seasoning and other foods: Mayonnaise. Premade sauces and marinades. ? The items listed may not be a complete list. Talk with your dietitian about what dietary choices are right for you. Summary  The Mediterranean diet includes both food and lifestyle choices.  Eat a variety of fresh fruits and vegetables, beans, nuts, seeds, and whole grains.  Limit the amount of red meat and sweets that you eat.  Talk with your health care provider about whether it is safe for you to drink red wine in moderation. This means 1 glass a day for nonpregnant women and 2 glasses a day for men. A glass of wine equals 5 oz (150 mL). This information  is not intended to replace advice given to you by your health care provider. Make sure you discuss any questions you have with your health care provider. Document Released: 05/01/2016 Document Revised: 06/03/2016 Document Reviewed: 05/01/2016 Elsevier Interactive Patient Education  2019 [employer].  Please start Phentermine once daily, each morning. Continue to drink plenty of water and follow Mediterranean diet. Increase regular exercise as tolerated- stationary bike and swimming [facility] Open swim). Follow-up in 3-4 weeks. WELCOME TO THE PRACTICE!

## 2018-10-05 NOTE — Assessment & Plan Note (Signed)
[place] Controlled Substance Database reviewed- last phentermine rx was June 2019 Last narcotic Sept 2019- 3 day supply of oxycodone Explained one month will be provided, follow-up in 3-4 weeks. 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 Current wt 225 Goal wt 180

## 2018-11-02 NOTE — Progress Notes (Signed)
Subjective:    Patient ID: Rebecca Adkins, female    DOB: Sep 10, 1991, 28 y.o.   MRN: [id number]  HPI: 10/05/2018 OV:  Rebecca Adkins is here to establish as a new pt.  She is a pleasant 28 year old female. 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, she has been unable to run or exercise vigorously  She previously participated in roller durby 6 days/week She smokes pack/day and has been eating a diet high in saturated fat/CHO She drink 5-10 cocktails over the weekend She has been on several oral medications for weight loss, with the most success with phentermine She denies CP/dyspnea/dizziness/palpitations/insomnia She declined smoking cessation today Discussed at length the smoking impedes healing   11/04/2018 OV: Rebecca Adkins is here for f/u: Medical Weight Loss She has been on Phentermine for one month, denies CP/dyspnea/dizziness/palpitations/insomia Current weight 223 She has lost 2 lbs, however she reports sig reduction in cravings for sugar and CHO She estimates to >80 oz water/day She continues to smoke, pack/day- declined smoking cessation She was cleared to resume [employer]- will start tonight! She reports increase in stress recently due to her mother's upcoming wedding next week  Patient Care Team    Relationship Specialty Notifications Start End  Esaw Grandchild, NP PCP - General Family Medicine  10/05/18   Magrinat, Virgie Dad, MD Consulting Physician Oncology  07/26/18   Leandrew Koyanagi, MD Attending Physician Orthopedic Surgery  07/26/18   Azucena Fallen, MD Consulting Physician Obstetrics and Gynecology  07/26/18   Sharlotte Alamo, DPM  Podiatry  10/05/18     Patient Active Problem List   Diagnosis Date Noted  . BMI 33.0-33.9,adult 10/05/2018  . Healthcare maintenance 10/05/2018  . At high risk for breast cancer 07/26/2018  . Family history of breast cancer   . Family history of ovarian cancer   . Family history of BRCA2 gene positive   . Closed displaced fracture of left clavicle 08/19/2016     Past Medical History:  Diagnosis Date  . Cancer (HCC)    cervical  . Clavicle fracture    left  . Elevated liver enzymes   . Family history of BRCA2 gene positive   . Family history of breast cancer   . Family history of ovarian cancer   . GERD (gastroesophageal reflux disease)   . Headache   . Ovarian cyst   . PONV (postoperative nausea and vomiting)   . Seizures ([place])    as a child only     Past Surgical History:  Procedure Laterality Date  . BUNIONECTOMY    . DILATION AND CURETTAGE OF UTERUS    . HARDWARE REMOVAL     left clavicle  . LEEP    . ORIF CLAVICULAR FRACTURE Left 08/21/2016   Procedure: OPEN REDUCTION INTERNAL FIXATION (ORIF) LEFT CLAVICLE FRACTURE;  Surgeon: Leandrew Koyanagi, MD;  Location: [facility];  Service: Orthopedics;  Laterality: Left;  . OVARY SURGERY       Family History  Problem Relation Age of Onset  . Hypertension Mother   . Ovarian cancer Mother 43  .  Diabetes Other   . Hypertension Other   . COPD Other   . Other Other   . Breast cancer Sister 63  . BRCA 1/2 Sister        BRCA2 pos  . Hypertension Sister   . Colon cancer Paternal Aunt        dx over 74  . Hypertension Maternal Grandmother   . Diabetes Maternal Grandmother   . Kidney disease Maternal Grandmother   . Stroke Maternal Grandfather   . COPD Paternal Grandmother   . Other Paternal Uncle        infant death  . Breast cancer Other        PGMs sister  . Breast cancer Other        PGMs sister     Social History   Substance and Sexual Activity  Drug Use No     Social History   Substance and  Sexual Activity  Alcohol Use Yes  . Alcohol/week: 13.0 standard drinks  . Types: 3 Glasses of wine, 10 Cans of beer per week     Social History   Tobacco Use  Smoking Status Current Every Day Smoker  . Packs/day: 1.00  . Years: 13.00  . Pack years: 13.00  . Types: Cigarettes  Smokeless Tobacco Never Used  Tobacco Comment   PT STATES SHE QUIT SMOKING 1 WEEK AGO     Outpatient Encounter Medications as of 11/04/2018  Medication Sig  . EPINEPHrine 0.3 mg/0.3 mL IJ SOAJ injection   . Multiple Vitamin (MULTIVITAMIN) tablet Take 1 tablet by mouth daily.  . norethindrone-ethinyl estradiol-iron (BLISOVI FE 1.5/30) 1.5-30 MG-MCG tablet Take 1 tablet by mouth daily.  . Phentermine HCl 37.5 MG TBDP Take 1 tablet by mouth daily.  . [DISCONTINUED] Phentermine HCl 37.5 MG TBDP Take 1 tablet by mouth daily.   No facility-administered encounter medications on file as of 11/04/2018.     Allergies: Articaine; Sodium bisulfate; Amoxicillin; Nickel; Penicillins; and Tramadol  Body mass index is 33.94 kg/m.  Blood pressure 108/70, pulse 98, temperature 98.8 F (37.1 C), temperature source Oral, height '5\' 8"'$  (1.727 m), weight 223 lb 3.2 oz (101.2 kg), SpO2 96 %.  Review of Systems  Constitutional: Positive for fatigue. Negative for activity change, appetite change, chills, diaphoresis, fever and unexpected weight change.  Eyes: Negative for visual disturbance.  Respiratory: Negative for cough, chest tightness, shortness of breath, wheezing and stridor.   Cardiovascular: Negative for chest pain, palpitations and leg swelling.  Gastrointestinal: Negative for abdominal distention, abdominal pain, blood in stool, constipation, diarrhea, nausea, rectal pain and vomiting.  Endocrine: Negative for cold intolerance, heat intolerance, polydipsia, polyphagia and polyuria.  Genitourinary: Negative for difficulty urinating and flank pain.  Musculoskeletal: Positive for arthralgias, gait problem and  myalgias. Negative for back pain, joint swelling, neck pain and neck stiffness.  Neurological: Negative for dizziness and headaches.  Hematological: Does not bruise/bleed easily.  Psychiatric/Behavioral: Negative for agitation, confusion, decreased concentration, dysphoric mood, hallucinations, self-injury, sleep disturbance and suicidal ideas. The patient is not nervous/anxious and is not hyperactive.        Objective:   Physical Exam Vitals signs and nursing note reviewed.  Constitutional:      General: She is not in acute distress.    Appearance: She is obese. She is not ill-appearing, toxic-appearing or diaphoretic.  HENT:     Head: Normocephalic and atraumatic.  Cardiovascular:     Rate and Rhythm: Normal rate.     Pulses: Normal pulses.  Heart sounds: Normal heart sounds. No murmur. No friction rub. No gallop.   Pulmonary:     Effort: Pulmonary effort is normal. No respiratory distress.     Breath sounds: Normal breath sounds. No wheezing or rhonchi.  Chest:     Chest wall: No tenderness.  Skin:    Capillary Refill: Capillary refill takes less than 2 seconds.  Neurological:     Mental Status: She is alert and oriented to person, place, and time.  Psychiatric:        Mood and Affect: Mood normal.        Behavior: Behavior normal.        Thought Content: Thought content normal.        Judgment: Judgment normal.       Assessment & Plan:   1. BMI 33.0-33.9,adult    BMI 33.0-33.9,adult [place] Controlled Substance Database reviewed- no aberrancies noted Current wt 223, only lost 2 lbs since starting Phentermine One month refill sent in, will need OV before 3/3 refill Continue all medications as directed. Reduce caffeine intake by 75%. Reduce tobacco use by 10% each week- you can do it! Resume regular exercise. Have a great time at your mother's wedding! Follow-up in 4 weeks.  FOLLOW-UP:  Return in about 4 weeks (around 12/02/2018) for Medical Weight  Loss.

## 2018-11-04 ENCOUNTER — Ambulatory Visit: Payer: BLUE CROSS/BLUE SHIELD | Admitting: Adult Health

## 2018-11-04 ENCOUNTER — Encounter: Payer: Self-pay | Admitting: Adult Health

## 2018-11-04 VITALS — BP 108/70 | HR 98 | Temp 98.8°F | Ht 68.0 in | Wt 223.2 lb

## 2018-11-04 DIAGNOSIS — Z6833 Body mass index (BMI) 33.0-33.9, adult: Secondary | ICD-10-CM | POA: Diagnosis not present

## 2018-11-04 MED ORDER — PHENTERMINE HCL 37.5 MG PO TBDP: 1.0000 | ORAL_TABLET | Freq: Every day | ORAL | 0 refills | Status: DC

## 2018-11-04 NOTE — Assessment & Plan Note (Signed)
[place] Controlled Substance Database reviewed- no aberrancies noted Current wt 223, only lost 2 lbs since starting Phentermine One month refill sent in, will need OV before 3/3 refill Continue all medications as directed. Reduce caffeine intake by 75%. Reduce tobacco use by 10% each week- you can do it! Resume regular exercise. Have a great time at your mother's wedding! Follow-up in 4 weeks.

## 2018-11-04 NOTE — Patient Instructions (Signed)
Exercising to Lose Weight Exercise is structured, repetitive physical activity to improve fitness and health. Getting regular exercise is important for everyone. It is especially important if you are overweight. Being overweight increases your risk of heart disease, stroke, diabetes, high blood pressure, and several types of cancer. Reducing your calorie intake and exercising can help you lose weight. Exercise is usually categorized as moderate or vigorous intensity. To lose weight, most people need to do a certain amount of moderate-intensity or vigorous-intensity exercise each week. Moderate-intensity exercise  Moderate-intensity exercise is any activity that gets you moving enough to burn at least three times more energy (calories) than if you were sitting. Examples of moderate exercise include:  Walking a mile in 15 minutes.  Doing light yard work.  Biking at an easy pace. Most people should get at least 150 minutes (2 hours and 30 minutes) a week of moderate-intensity exercise to maintain their body weight. Vigorous-intensity exercise Vigorous-intensity exercise is any activity that gets you moving enough to burn at least six times more calories than if you were sitting. When you exercise at this intensity, you should be working hard enough that you are not able to carry on a conversation. Examples of vigorous exercise include:  Running.  Playing a team sport, such as football, basketball, and soccer.  Jumping rope. Most people should get at least 75 minutes (1 hour and 15 minutes) a week of vigorous-intensity exercise to maintain their body weight. How can exercise affect me? When you exercise enough to burn more calories than you eat, you lose weight. Exercise also reduces body fat and builds muscle. The more muscle you have, the more calories you burn. Exercise also:  Improves mood.  Reduces stress and tension.  Improves your overall fitness, flexibility, and  endurance.  Increases bone strength. The amount of exercise you need to lose weight depends on:  Your age.  The type of exercise.  Any health conditions you have.  Your overall physical ability. Talk to your health care provider about how much exercise you need and what types of activities are safe for you. What actions can I take to lose weight? Nutrition   Make changes to your diet as told by your health care provider or diet and nutrition specialist (dietitian). This may include: ? Eating fewer calories. ? Eating more protein. ? Eating less unhealthy fats. ? Eating a diet that includes fresh fruits and vegetables, whole grains, low-fat dairy products, and lean protein. ? Avoiding foods with added fat, salt, and sugar.  Drink plenty of water while you exercise to prevent dehydration or heat stroke. Activity  Choose an activity that you enjoy and set realistic goals. Your health care provider can help you make an exercise plan that works for you.  Exercise at a moderate or vigorous intensity most days of the week. ? The intensity of exercise may vary from person to person. You can tell how intense a workout is for you by paying attention to your breathing and heartbeat. Most people will notice their breathing and heartbeat get faster with more intense exercise.  Do resistance training twice each week, such as: ? Push-ups. ? Sit-ups. ? Lifting weights. ? Using resistance bands.  Getting short amounts of exercise can be just as helpful as long structured periods of exercise. If you have trouble finding time to exercise, try to include exercise in your daily routine. ? Get up, stretch, and walk around every 30 minutes throughout the day. ? Go for a  walk during your lunch break. ? Park your car farther away from your destination. ? If you take public transportation, get off one stop early and walk the rest of the way. ? Make phone calls while standing up and walking  around. ? Take the stairs instead of elevators or escalators.  Wear comfortable clothes and shoes with good support.  Do not exercise so much that you hurt yourself, feel dizzy, or get very short of breath. Where to find more information  U.S. Department of Health and Human Services: [employer].at  Centers for Disease Control and Prevention (CDC): http://www.wolf.info/ Contact a health care provider:  Before starting a new exercise program.  If you have questions or concerns about your weight.  If you have a medical problem that keeps you from exercising. Get help right away if you have any of the following while exercising:  Injury.  Dizziness.  Difficulty breathing or shortness of breath that does not go away when you stop exercising.  Chest pain.  Rapid heartbeat. Summary  Being overweight increases your risk of heart disease, stroke, diabetes, high blood pressure, and several types of cancer.  Losing weight happens when you burn more calories than you eat.  Reducing the amount of calories you eat in addition to getting regular moderate or vigorous exercise each week helps you lose weight. This information is not intended to replace advice given to you by your health care provider. Make sure you discuss any questions you have with your health care provider. Document Released: 10/11/2010 Document Revised: 09/21/2017 Document Reviewed: 09/21/2017 Elsevier Interactive Patient Education  2019 [place] Following a healthy eating pattern may help you to achieve and maintain a healthy body weight, reduce the risk of chronic disease, and live a long and productive life. It is important to follow a healthy eating pattern at an appropriate calorie level for your body. Your nutritional needs should be met primarily through food by choosing a variety of nutrient-rich foods. What are tips for following this plan? Reading food labels  Read labels and choose the  following: ? Reduced or low sodium. ? Juices with 100% fruit juice. ? Foods with low saturated fats and high polyunsaturated and monounsaturated fats. ? Foods with whole grains, such as whole wheat, cracked wheat, brown rice, and wild rice. ? Whole grains that are fortified with folic acid. This is recommended for women who are pregnant or who want to become pregnant.  Read labels and avoid the following: ? Foods with a lot of added sugars. These include foods that contain brown sugar, corn sweetener, corn syrup, dextrose, fructose, glucose, high-fructose corn syrup, honey, invert sugar, lactose, malt syrup, maltose, molasses, raw sugar, sucrose, trehalose, or turbinado sugar.  Do not eat more than the following amounts of added sugar per day:  6 teaspoons (25 g) for women.  9 teaspoons (38 g) for men. ? Foods that contain processed or refined starches and grains. ? Refined grain products, such as white flour, degermed cornmeal, white bread, and white rice. Shopping  Choose nutrient-rich snacks, such as vegetables, whole fruits, and nuts. Avoid high-calorie and high-sugar snacks, such as potato chips, fruit snacks, and candy.  Use oil-based dressings and spreads on foods instead of solid fats such as butter, stick margarine, or cream cheese.  Limit pre-made sauces, mixes, and "instant" products such as flavored rice, instant noodles, and ready-made pasta.  Try more plant-protein sources, such as tofu, tempeh, black beans, edamame, lentils, nuts, and seeds.  Explore eating plans such as the Mediterranean diet or vegetarian diet. Cooking  Use oil to saut or stir-fry foods instead of solid fats such as butter, stick margarine, or lard.  Try baking, boiling, grilling, or broiling instead of frying.  Remove the fatty part of meats before cooking.  Steam vegetables in water or broth. Meal planning   At meals, imagine dividing your plate into fourths: ? One-half of your plate is  fruits and vegetables. ? One-fourth of your plate is whole grains. ? One-fourth of your plate is protein, especially lean meats, poultry, eggs, tofu, beans, or nuts.  Include low-fat dairy as part of your daily diet. Lifestyle  Choose healthy options in all settings, including home, work, school, restaurants, or stores.  Prepare your food safely: ? Wash your hands after handling raw meats. ? Keep food preparation surfaces clean by regularly washing with hot, soapy water. ? Keep raw meats separate from ready-to-eat foods, such as fruits and vegetables. ? Cook seafood, meat, poultry, and eggs to the recommended internal temperature. ? Store foods at safe temperatures. In general:  Keep cold foods at 40F (4.4C) or below.  Keep hot foods at 140F (60C) or above.  Keep your freezer at 0F (-17.8C) or below.  Foods are no longer safe to eat when they have been between the temperatures of 40-140F (4.4-60C) for more than 2 hours. What foods should I eat? Fruits Aim to eat 2 cup-equivalents of fresh, canned (in natural juice), or frozen fruits each day. Examples of 1 cup-equivalent of fruit include 1 small apple, 8 large strawberries, 1 cup canned fruit,  cup dried fruit, or 1 cup 100% juice. Vegetables Aim to eat 2-3 cup-equivalents of fresh and frozen vegetables each day, including different varieties and colors. Examples of 1 cup-equivalent of vegetables include 2 medium carrots, 2 cups raw, leafy greens, 1 cup chopped vegetable (raw or cooked), or 1 medium baked potato. Grains Aim to eat 6 ounce-equivalents of whole grains each day. Examples of 1 ounce-equivalent of grains include 1 slice of bread, 1 cup ready-to-eat cereal, 3 cups popcorn, or  cup cooked rice, pasta, or cereal. Meats and other proteins Aim to eat 5-6 ounce-equivalents of protein each day. Examples of 1 ounce-equivalent of protein include 1 egg, 1/2 cup nuts or seeds, or 1 tablespoon (16 g) peanut butter. A cut of  meat or fish that is the size of a deck of cards is about 3-4 ounce-equivalents.  Of the protein you eat each week, try to have at least 8 ounces come from seafood. This includes salmon, trout, herring, and anchovies. Dairy Aim to eat 3 cup-equivalents of fat-free or low-fat dairy each day. Examples of 1 cup-equivalent of dairy include 1 cup (240 mL) milk, 8 ounces (250 g) yogurt, 1 ounces (44 g) natural cheese, or 1 cup (240 mL) fortified soy milk. Fats and oils  Aim for about 5 teaspoons (21 g) per day. Choose monounsaturated fats, such as canola and olive oils, avocados, peanut butter, and most nuts, or polyunsaturated fats, such as sunflower, corn, and soybean oils, walnuts, pine nuts, sesame seeds, sunflower seeds, and flaxseed. Beverages  Aim for six 8-oz glasses of water per day. Limit coffee to three to five 8-oz cups per day.  Limit caffeinated beverages that have added calories, such as soda and energy drinks.  Limit alcohol intake to no more than 1 drink a day for nonpregnant women and 2 drinks a day for men. One drink equals 12 oz of   beer (355 mL), 5 oz of wine (148 mL), or 1 oz of hard liquor (44 mL). Seasoning and other foods  Avoid adding excess amounts of salt to your foods. Try flavoring foods with herbs and spices instead of salt.  Avoid adding sugar to foods.  Try using oil-based dressings, sauces, and spreads instead of solid fats. This information is based on general [place] nutrition guidelines. For more information, visit Build[employer].es. Exact amounts may vary based on your nutrition needs. Summary  A healthy eating plan may help you to maintain a healthy weight, reduce the risk of chronic diseases, and stay active throughout your life.  Plan your meals. Make sure you eat the right portions of a variety of nutrient-rich foods.  Try baking, boiling, grilling, or broiling instead of frying.  Choose healthy options in all settings, including home, work, school,  restaurants, or stores. This information is not intended to replace advice given to you by your health care provider. Make sure you discuss any questions you have with your health care provider. Document Released: 12/21/2017 Document Revised: 12/21/2017 Document Reviewed: 12/21/2017 Elsevier Interactive Patient Education  2019 [place] all medications as directed. Reduce caffeine intake by 75%. Reduce tobacco use by 10% each week- you can do it! Resume regular exercise. Have a great time at your mother's wedding! Follow-up in 4 weeks. GREAT TO SEE YOU!

## 2018-11-08 ENCOUNTER — Ambulatory Visit: Payer: BLUE CROSS/BLUE SHIELD | Admitting: Adult Health

## 2018-11-26 DIAGNOSIS — E229 Hyperfunction of pituitary gland, unspecified: Secondary | ICD-10-CM | POA: Diagnosis not present

## 2018-11-26 DIAGNOSIS — N83209 Unspecified ovarian cyst, unspecified side: Secondary | ICD-10-CM | POA: Diagnosis not present

## 2018-11-26 DIAGNOSIS — E669 Obesity, unspecified: Secondary | ICD-10-CM | POA: Diagnosis not present

## 2018-11-26 DIAGNOSIS — F17210 Nicotine dependence, cigarettes, uncomplicated: Secondary | ICD-10-CM | POA: Diagnosis not present

## 2018-12-01 NOTE — Progress Notes (Signed)
Subjective:    Patient ID: Lua Feng, female    DOB: Jul 27, 1991, 28 y.o.   MRN: [id number]  HPI: 10/05/2018 OV:  Ms. Burkman is here to establish as a new pt.  She is a pleasant 28 year old female. 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 Agro is here for f/u: Medical Weight Loss She has been on Phentermine for one month, denies SE CP/dyspnea/dizziness/palpitations/insomia Current weight 223 She has lost 2 lbs, however she reports sig reduction in cravings for sugar and CHO She estimates to >80 oz water/day She continues to smoke, pack/day- declined smoking cessation She was cleared to resume [employer]- will start tonight! She reports increase in stress recently due to her mother's upcoming wedding next week  12/06/2018 OV: Ms. Doubrava presents for f/u: Medical Weight Loss She stopped Phentermine 2 weeks ago due to "feeling just odd on the medication" She has not lost any weight despite- Skating 3 days/week 28mns strength training 5 days week Following 1500 cal diet/day- mostly lean protein and complex CHO She has dramatically reduced ETOH use in  last 6 weeks She has been reducing tobacco use- 1/2 pack/day She has tried Wellbutrin in past for tobacco cessation, did not tolerate well, re: "jitters" Will check TSH today, she believes her mother has hypothyroidism  Patient Care Team    Relationship Specialty Notifications Start End  DEsaw Grandchild NP PCP - General Family Medicine  10/05/18   Magrinat, GVirgie Dad MD Consulting Physician Oncology  07/26/18   XLeandrew Koyanagi MD Attending Physician Orthopedic Surgery  07/26/18   MAzucena Fallen MD Consulting Physician Obstetrics and Gynecology  07/26/18   CSharlotte Alamo DPM  Podiatry  10/05/18     Patient Active Problem List   Diagnosis Date Noted  . Weight gain 12/06/2018  . BMI 33.0-33.9,adult 10/05/2018  . Healthcare maintenance 10/05/2018  . At high risk for breast cancer 07/26/2018  . Family history of breast cancer   . Family history of ovarian cancer   . Family history of BRCA2 gene positive   . Closed displaced fracture of left clavicle 08/19/2016     Past Medical History:  Diagnosis Date  . Cancer (HCC)    cervical  . Clavicle fracture    left  . Elevated liver enzymes   . Family history of BRCA2 gene positive   . Family history of breast cancer   . Family history of ovarian cancer   . GERD (gastroesophageal reflux disease)   . Headache   . Ovarian cyst   . PONV (postoperative nausea and vomiting)   . [place]  Past Surgical History:  Procedure Laterality Date  . BUNIONECTOMY    . DILATION AND CURETTAGE OF UTERUS    . HARDWARE REMOVAL     left clavicle  . LEEP    . ORIF CLAVICULAR FRACTURE Left 08/21/2016   Procedure: OPEN REDUCTION INTERNAL FIXATION (ORIF) LEFT CLAVICLE FRACTURE;  Surgeon: Leandrew Koyanagi, MD;  Location: [place];  Service: Orthopedics;  Laterality: Left;  . OVARY SURGERY       Family History  Problem Relation Age of Onset  . Hypertension Mother   . Ovarian cancer Mother 71  . Diabetes Other   . Hypertension  Other   . COPD Other   . Other Other   . Breast cancer Sister 38  . BRCA 1/2 Sister        BRCA2 pos  . Hypertension Sister   . Colon cancer Paternal Aunt        dx over 34  . Hypertension Maternal Grandmother   . Diabetes Maternal Grandmother   . Kidney disease Maternal Grandmother   . Stroke Maternal Grandfather   . COPD Paternal Grandmother   . Other Paternal Uncle        infant death  . Breast cancer Other        PGMs sister  . Breast cancer Other        PGMs sister     Social History   Substance and Sexual Activity  Drug Use No     Social History   Substance and Sexual Activity  Alcohol Use Yes  . Alcohol/week: 13.0 standard drinks  . Types: 3 Glasses of wine, 10 Cans of beer per week     Social History   Tobacco Use  Smoking Status Current Every Day Smoker  . Packs/day: 1.00  . Years: 13.00  . Pack years: 13.00  . Types: Cigarettes  Smokeless Tobacco Never Used  Tobacco Comment   PT STATES SHE QUIT SMOKING 1 WEEK AGO     Outpatient Encounter Medications as of 12/06/2018  Medication Sig  . EPINEPHrine 0.3 mg/0.3 mL IJ SOAJ injection   . Multiple Vitamin (MULTIVITAMIN) tablet Take 1 tablet by mouth daily.  . norethindrone-ethinyl estradiol-iron (BLISOVI FE 1.5/30) 1.5-30 MG-MCG tablet Take 1 tablet by mouth daily.  . [DISCONTINUED] Phentermine HCl 37.5 MG TBDP Take 1 tablet by mouth daily.   No facility-administered encounter medications on file as of 12/06/2018.     Allergies: Articaine; Sodium bisulfate; Amoxicillin; Nickel; Penicillins; and Tramadol  Body mass index is 34.26 kg/m.  Blood pressure 116/73, pulse 93, temperature 98.7 F (37.1 C), temperature source Oral, height '5\' 8"'$  (1.727 m), weight 225 lb 4.8 oz (102.2 kg), SpO2 99 %.  Review of Systems  Constitutional: Positive for fatigue. Negative for activity change, appetite change, chills, diaphoresis, fever and unexpected weight change.  Eyes: Negative for visual disturbance.   Respiratory: Negative for cough, chest tightness, shortness of breath, wheezing and stridor.   Cardiovascular: Negative for chest pain, palpitations and leg swelling.  Gastrointestinal: Negative for abdominal distention, abdominal pain, blood in stool, constipation, diarrhea, nausea, rectal pain and vomiting.  Endocrine: Negative for cold intolerance, heat intolerance, polydipsia, polyphagia and polyuria.  Genitourinary: Negative for difficulty urinating and flank pain.  Musculoskeletal: Positive for arthralgias, gait problem and myalgias. Negative for back pain, joint swelling, neck pain and neck stiffness.  Neurological: Negative for dizziness and headaches.  Hematological: Does not bruise/bleed easily.  Psychiatric/Behavioral: Negative for agitation, confusion, decreased concentration, dysphoric mood, hallucinations, self-injury, sleep  disturbance and suicidal ideas. The patient is not nervous/anxious and is not hyperactive.        Objective:   Physical Exam Vitals signs and nursing note reviewed.  Constitutional:      General: She is not in acute distress.    Appearance: She is obese. She is not ill-appearing, toxic-appearing or diaphoretic.  HENT:     Head: Normocephalic and atraumatic.  Cardiovascular:     Rate and Rhythm: Normal rate.     Pulses: Normal pulses.     Heart sounds: Normal heart sounds. No murmur. No friction rub. No gallop.   Pulmonary:     Effort: Pulmonary effort is normal. No respiratory distress.     Breath sounds: Normal breath sounds. No wheezing or rhonchi.  Chest:     Chest wall: No tenderness.  Skin:    Capillary Refill: Capillary refill takes less than 2 seconds.  Neurological:     Mental Status: She is alert and oriented to person, place, and time.  Psychiatric:        Mood and Affect: Mood normal.        Behavior: Behavior normal.        Thought Content: Thought content normal.        Judgment: Judgment normal.       Assessment & Plan:    1. Weight gain   2. BMI 34.0-34.9,adult    BMI 33.0-33.9,adult Please remain off Phentermine Recommend Lose it App to help track all of your healthy habits. Continue regular exercise and healthy eating. Will call you when TSH lab results available. Referral to Medical Weight Management placed. Follow-up 6 months for complete physical, fasting labs the week prior  FOLLOW-UP:  Return in about 6 months (around 06/08/2019) for CPE, Fasting Labs.

## 2018-12-06 ENCOUNTER — Other Ambulatory Visit: Payer: Self-pay

## 2018-12-06 ENCOUNTER — Encounter: Payer: Self-pay | Admitting: Adult Health

## 2018-12-06 ENCOUNTER — Ambulatory Visit: Payer: BLUE CROSS/BLUE SHIELD | Admitting: Adult Health

## 2018-12-06 VITALS — BP 116/73 | HR 93 | Temp 98.7°F | Ht 68.0 in | Wt 225.3 lb

## 2018-12-06 DIAGNOSIS — Z6834 Body mass index (BMI) 34.0-34.9, adult: Secondary | ICD-10-CM

## 2018-12-06 DIAGNOSIS — R635 Abnormal weight gain: Secondary | ICD-10-CM

## 2018-12-06 NOTE — Patient Instructions (Addendum)
Exercising to Lose Weight Exercise is structured, repetitive physical activity to improve fitness and health. Getting regular exercise is important for everyone. It is especially important if you are overweight. Being overweight increases your risk of heart disease, stroke, diabetes, high blood pressure, and several types of cancer. Reducing your calorie intake and exercising can help you lose weight. Exercise is usually categorized as moderate or vigorous intensity. To lose weight, most people need to do a certain amount of moderate-intensity or vigorous-intensity exercise each week. Moderate-intensity exercise  Moderate-intensity exercise is any activity that gets you moving enough to burn at least three times more energy (calories) than if you were sitting. Examples of moderate exercise include:  Walking a mile in 15 minutes.  Doing light yard work.  Biking at an easy pace. Most people should get at least 150 minutes (2 hours and 30 minutes) a week of moderate-intensity exercise to maintain their body weight. Vigorous-intensity exercise Vigorous-intensity exercise is any activity that gets you moving enough to burn at least six times more calories than if you were sitting. When you exercise at this intensity, you should be working hard enough that you are not able to carry on a conversation. Examples of vigorous exercise include:  Running.  Playing a team sport, such as football, basketball, and soccer.  Jumping rope. Most people should get at least 75 minutes (1 hour and 15 minutes) a week of vigorous-intensity exercise to maintain their body weight. How can exercise affect me? When you exercise enough to burn more calories than you eat, you lose weight. Exercise also reduces body fat and builds muscle. The more muscle you have, the more calories you burn. Exercise also:  Improves mood.  Reduces stress and tension.  Improves your overall fitness, flexibility, and endurance.   Increases bone strength. The amount of exercise you need to lose weight depends on:  Your age.  The type of exercise.  Any health conditions you have.  Your overall physical ability. Talk to your health care provider about how much exercise you need and what types of activities are safe for you. What actions can I take to lose weight? Nutrition   Make changes to your diet as told by your health care provider or diet and nutrition specialist (dietitian). This may include: ? Eating fewer calories. ? Eating more protein. ? Eating less unhealthy fats. ? Eating a diet that includes fresh fruits and vegetables, whole grains, low-fat dairy products, and lean protein. ? Avoiding foods with added fat, salt, and sugar.  Drink plenty of water while you exercise to prevent dehydration or heat stroke. Activity  Choose an activity that you enjoy and set realistic goals. Your health care provider can help you make an exercise plan that works for you.  Exercise at a moderate or vigorous intensity most days of the week. ? The intensity of exercise may vary from person to person. You can tell how intense a workout is for you by paying attention to your breathing and heartbeat. Most people will notice their breathing and heartbeat get faster with more intense exercise.  Do resistance training twice each week, such as: ? Push-ups. ? Sit-ups. ? Lifting weights. ? Using resistance bands.  Getting short amounts of exercise can be just as helpful as long structured periods of exercise. If you have trouble finding time to exercise, try to include exercise in your daily routine. ? Get up, stretch, and walk around every 30 minutes throughout the day. ? Go for a   walk during your lunch break. ? Park your car farther away from your destination. ? If you take public transportation, get off one stop early and walk the rest of the way. ? Make phone calls while standing up and walking around. ? Take the  stairs instead of elevators or escalators.  Wear comfortable clothes and shoes with good support.  Do not exercise so much that you hurt yourself, feel dizzy, or get very short of breath. Where to find more information  U.S. Department of Health and Human Services: [employer]  Centers for Disease Control and Prevention (CDC): http://www.wolf.info/ Contact a health care provider:  Before starting a new exercise program.  If you have questions or concerns about your weight.  If you have a medical problem that keeps you from exercising. Get help right away if you have any of the following while exercising:  Injury.  Dizziness.  Difficulty breathing or shortness of breath that does not go away when you stop exercising.  Chest pain.  Rapid heartbeat. Summary  Being overweight increases your risk of heart disease, stroke, diabetes, high blood pressure, and several types of cancer.  Losing weight happens when you burn more calories than you eat.  Reducing the amount of calories you eat in addition to getting regular moderate or vigorous exercise each week helps you lose weight. This information is not intended to replace advice given to you by your health care provider. Make sure you discuss any questions you have with your health care provider. Document Released: 10/11/2010 Document Revised: 09/21/2017 Document Reviewed: 09/21/2017 Elsevier Interactive Patient Education  2019 [employer].   Please remain off Phentermine Recommend Lose it App to help track all of your healthy habits. Continue regular exercise and healthy eating. Will call you when TSH lab results available. Referral to Medical Weight Management placed. Follow-up 6 months for complete physical, fasting labs the week prior. GREAT TO SEE YOU!

## 2018-12-06 NOTE — Assessment & Plan Note (Signed)
Please remain off Phentermine Recommend Lose it App to help track all of your healthy habits. Continue regular exercise and healthy eating. Will call you when TSH lab results available. Referral to Medical Weight Management placed. Follow-up 6 months for complete physical, fasting labs the week prior

## 2018-12-07 LAB — TSH: TSH: 2.5 u[IU]/mL (ref 0.450–4.500)

## 2019-02-11 ENCOUNTER — Other Ambulatory Visit: Payer: Self-pay

## 2019-02-11 ENCOUNTER — Emergency Department (HOSPITAL_COMMUNITY)
Admission: EM | Admit: 2019-02-11 | Discharge: 2019-02-11 | Disposition: A | Discharge status: Home / Self Care | Payer: BLUE CROSS/BLUE SHIELD | Attending: Emergency Medicine | Admitting: Emergency Medicine

## 2019-02-11 DIAGNOSIS — K0889 Other specified disorders of teeth and supporting structures: Secondary | ICD-10-CM | POA: Diagnosis not present

## 2019-02-11 DIAGNOSIS — Z87891 Personal history of nicotine dependence: Secondary | ICD-10-CM | POA: Insufficient documentation

## 2019-02-11 DIAGNOSIS — L299 Pruritus, unspecified: Secondary | ICD-10-CM | POA: Diagnosis not present

## 2019-02-11 DIAGNOSIS — R21 Rash and other nonspecific skin eruption: Secondary | ICD-10-CM | POA: Insufficient documentation

## 2019-02-11 DIAGNOSIS — K029 Dental caries, unspecified: Secondary | ICD-10-CM | POA: Insufficient documentation

## 2019-02-11 DIAGNOSIS — Z793 Long term (current) use of hormonal contraceptives: Secondary | ICD-10-CM | POA: Insufficient documentation

## 2019-02-11 DIAGNOSIS — Z79899 Other long term (current) drug therapy: Secondary | ICD-10-CM | POA: Diagnosis not present

## 2019-02-11 DIAGNOSIS — Z8541 Personal history of malignant neoplasm of cervix uteri: Secondary | ICD-10-CM | POA: Diagnosis not present

## 2019-02-11 DIAGNOSIS — R000 Tachycardia, unspecified: Secondary | ICD-10-CM | POA: Diagnosis not present

## 2019-02-11 DIAGNOSIS — T7840XA Allergy, unspecified, initial encounter: Secondary | ICD-10-CM | POA: Diagnosis not present

## 2019-02-11 DIAGNOSIS — I959 Hypotension, unspecified: Secondary | ICD-10-CM | POA: Diagnosis not present

## 2019-02-11 LAB — POC URINE PREG, ED: Preg Test, Ur: NEGATIVE

## 2019-02-11 MED ORDER — METRONIDAZOLE 500 MG PO TABS: 500.0000 mg | ORAL_TABLET | Freq: Two times a day (BID) | ORAL | 0 refills | Status: DC

## 2019-02-11 MED ORDER — CEFUROXIME AXETIL 500 MG PO TABS: 500.0000 mg | ORAL_TABLET | Freq: Two times a day (BID) | ORAL | 0 refills | Status: AC

## 2019-02-11 MED ORDER — FAMOTIDINE 20 MG PO TABS
20.0000 mg | ORAL_TABLET | Freq: Once | ORAL | Status: AC
  Administered 2019-02-11: 20 mg via ORAL
  Filled 2019-02-11: qty 1

## 2019-02-11 MED ORDER — PREDNISONE 20 MG PO TABS
60.0000 mg | ORAL_TABLET | Freq: Once | ORAL | Status: AC
  Administered 2019-02-11: 20:00:00 60 mg via ORAL
  Filled 2019-02-11: qty 3

## 2019-02-11 MED ORDER — CHLORHEXIDINE GLUCONATE 0.12 % MT SOLN: 15.0000 mL | Freq: Two times a day (BID) | OROMUCOSAL | 0 refills | Status: DC

## 2019-02-11 NOTE — ED Notes (Signed)
Awaiting pregnancy results

## 2019-02-11 NOTE — Discharge Instructions (Addendum)
Please read and follow all provided instructions.  Your diagnoses today include:  1. Allergic reaction, initial encounter     Tests performed today include: Vital signs. See below for your results today.   Medications prescribed:   Take any prescribed medications only as directed.  Please continue the Medrol Dosepak you were given by your dentist but do not take the first day.  We gave you first dose.  Please increase your Pepcid to twice a day for 3 days.  You may either take Benadryl, 25 mg every 6-8 hours or Zyrtec (Cetirizine) at nigh for 3 days.   You are prescribed chlorhexidine solution swish and spit twice daily. Do not swallow. This is to clear bacteria from your mouth.   You are prescribed viscous lidocaine swish and spit every 6 hours as needed for tooth and gum discomfort. Do not swallow.   Flagyl is an antibiotic used to treat a mouth infection. This medication CANNOT be taken with alcohol, because it can cause nausea and vomiting combined with alcohol. Please also refrain from drinking alcohol for 48 hours after you finish the medicine.  Please take all of your antibiotics until finished.   You may develop abdominal discomfort or nausea from the antibiotic. If this occurs, you may take it with food. Some patients also get diarrhea with antibiotics. You may help offset this with probiotics which you can buy or get in yogurt. Do not eat or take the probiotics until 2 hours after your antibiotic. Some women develop vaginal yeast infections after antibiotics. If you develop unusual vaginal discharge after being on this medication, please see your primary care provider.   Some people develop allergies to antibiotics. Symptoms of antibiotic allergy can be mild and include a flat rash and itching. They can also be more serious and include:  ?Hives - Hives are raised, red patches of skin that are usually very itchy.  ?Lip or tongue swelling  ?Trouble swallowing or  breathing  ?Blistering of the skin or mouth.  If you have any of these serious symptoms, please seek emergency medical care immediately.  Home care instructions:  Follow any educational materials contained in this packet  Follow-up instructions: Please follow-up with your primary care provider in the next 3 days for further evaluation of your symptoms.   Return instructions:  Please return to the Emergency Department if you experience worsening symptoms.  Call 9-1-1 immediately if you have an allergic reaction that involves your lips, mouth, throat or if you have any difficulty breathing. This is a life-threatening emergency.  Please return if you have any other emergent concerns.  Additional Information:  Your vital signs today were: BP 133/73    Pulse 92    Temp 98.7 F (37.1 C) (Oral)    Resp 18    Ht 5\' 8"  (1.727 m)    Wt 102.1 kg    SpO2 98%    BMI 34.21 kg/m  If your blood pressure (BP) was elevated above 130/80 this visit, please have this repeated by your doctor within one month. --------------  Thank you for allowing [place] to participate in your care today!

## 2019-02-11 NOTE — ED Provider Notes (Signed)
[facility] EMERGENCY DEPARTMENT Provider Note   CSN: [id number] Arrival date & time: 02/11/19  1728    History   Chief Complaint Chief Complaint  Patient presents with  . Allergic Reaction    HPI Rebecca Adkins is a 28 y.o. female.     HPI  Patient is a 28 yo female with a past medical history of cervical cancer presenting for allergy reaction. She reports beginning clindamycin today for dental pain prescribed by her dentist in the electronic visit.  She reports that approximately 15 to 20 minutes after ingesting clindamycin she experienced pruritus primarily on the extremities as well as feeling anxious and having palpitations.  She denies any sensation of throat closure, angioedema, wheezing, lightheadedness syncope or presyncope.  Patient denies any abdominal cramping, abdominal pain, nausea, vomiting, or diarrhea. Patient reports he took 50 mg of oral Benadryl before calling EMS.  Patient has an EpiPen at home but did not administer it.  Clindamycin is not a known allergen to patient but she does have multiple other antibiotic allergies.  Regarding dental pain she denies any difficulty breathing, swallowing, feeling of facial swelling or drainage in the mouth.  Past Medical History:  Diagnosis Date  . Cancer (HCC)    cervical  . Clavicle fracture    left  . Elevated liver enzymes   . Family history of BRCA2 gene positive   . Family history of breast cancer   . Family history of ovarian cancer   . GERD (gastroesophageal reflux disease)   . Headache   . Ovarian cyst   . PONV (postoperative nausea and vomiting)   . Seizures ([place])    as a child only    Patient Active Problem List   Diagnosis Date Noted  . Weight gain 12/06/2018  . BMI 33.0-33.9,adult 10/05/2018  . Healthcare maintenance 10/05/2018  . At high risk for breast cancer 07/26/2018  . Family history of breast cancer   . Family history of ovarian cancer   . Family history of BRCA2 gene  positive   . Closed displaced fracture of left clavicle 08/19/2016    Past Surgical History:  Procedure Laterality Date  . BUNIONECTOMY    . DILATION AND CURETTAGE OF UTERUS    . HARDWARE REMOVAL     left clavicle  . LEEP    . ORIF CLAVICULAR FRACTURE Left 08/21/2016   Procedure: OPEN REDUCTION INTERNAL FIXATION (ORIF) LEFT CLAVICLE FRACTURE;  Surgeon: Leandrew Koyanagi, MD;  Location: [facility];  Service: Orthopedics;  Laterality: Left;  . OVARY SURGERY       OB History   No obstetric history on file.      Home Medications    Prior to Admission medications   Medication Sig Start Date End Date Taking? Authorizing Provider  EPINEPHrine 0.3 mg/0.3 mL IJ SOAJ injection  06/01/18   [provider]  Multiple Vitamin (MULTIVITAMIN) tablet Take 1 tablet by mouth daily.    [provider]  norethindrone-ethinyl estradiol-iron (BLISOVI FE 1.5/30) 1.5-30 MG-MCG tablet Take 1 tablet by mouth daily.    [provider]    Family History Family History  Problem Relation Age of Onset  . Hypertension Mother   . Ovarian cancer Mother 63  . Diabetes Other   . Hypertension Other   . COPD Other   . Other Other   . Breast cancer Sister 60  . BRCA 1/2 Sister        BRCA2 pos  . Hypertension Sister   .  Colon cancer Paternal Aunt        dx over 43  . Hypertension Maternal Grandmother   . Diabetes Maternal Grandmother   . Kidney disease Maternal Grandmother   . Stroke Maternal Grandfather   . COPD Paternal Grandmother   . Other Paternal Uncle        infant death  . Breast cancer Other        PGMs sister  . Breast cancer Other        PGMs sister    Social History Social History   Tobacco Use  . Smoking status: Current Every Day Smoker    Packs/day: 1.00    Years: 13.00    Pack years: 13.00    Types: Cigarettes  . Smokeless tobacco: Never Used  . Tobacco comment: PT STATES SHE QUIT SMOKING 1 WEEK AGO  Substance Use Topics  . Alcohol use: Yes     Alcohol/week: 13.0 standard drinks    Types: 3 Glasses of wine, 10 Cans of beer per week  . Drug use: No     Allergies   Articaine; Sodium bisulfate; Amoxicillin; Nickel; Penicillins; and Tramadol   Review of Systems Review of Systems  Constitutional: Negative for chills and fever.  Respiratory: Negative for shortness of breath and wheezing.   Gastrointestinal: Negative for abdominal pain, nausea and vomiting.  Skin: Positive for rash. Negative for color change.  Neurological: Negative for syncope.  All other systems reviewed and are negative.    Physical Exam Updated Vital Signs BP 133/73   Pulse 92   Temp 98.7 F (37.1 C) (Oral)   Resp 18   Ht '5\' 8"'$  (1.727 m)   Wt 102.1 kg   SpO2 98%   BMI 34.21 kg/m   Physical Exam Vitals signs and nursing note reviewed.  Constitutional:      General: She is not in acute distress.    Appearance: She is well-developed.  HENT:     Head: Normocephalic and atraumatic.     Mouth/Throat:      Comments: Dental cavities and poor oral dentition noted. Pain along tooth as depicted in image. No abscess noted. Midline uvula. No trismus. OP clear and moist. No oropharyngeal erythema or edema. Neck supple with no tenderness. No facial edema.  Eyes:     Conjunctiva/sclera: Conjunctivae normal.     Pupils: Pupils are equal, round, and reactive to light.  Neck:     Musculoskeletal: Normal range of motion and neck supple.  Cardiovascular:     Rate and Rhythm: Normal rate and regular rhythm.     Heart sounds: S1 normal and S2 normal. No murmur.  Pulmonary:     Effort: Pulmonary effort is normal.     Breath sounds: Normal breath sounds. No wheezing or rales.  Abdominal:     General: There is no distension.     Palpations: Abdomen is soft.     Tenderness: There is no abdominal tenderness. There is no guarding.  Musculoskeletal: Normal range of motion.        General: No deformity.  Lymphadenopathy:     Cervical: No cervical adenopathy.   Skin:    General: Skin is warm and dry.     Findings: Rash present. No erythema.     Comments: Mild urticaria noted on lower legs. No urticaria of arms, face, or chest.   Neurological:     Mental Status: She is alert.     Comments: Cranial nerves grossly intact. Patient moves extremities symmetrically and  with good coordination.  Psychiatric:        Behavior: Behavior normal.        Thought Content: Thought content normal.        Judgment: Judgment normal.      ED Treatments / Results  Labs (all labs ordered are listed, but only abnormal results are displayed) Labs Reviewed  POC URINE PREG, ED    EKG None  Radiology No results found.  Procedures Procedures (including critical care time)  Medications Ordered in ED Medications  predniSONE (DELTASONE) tablet 60 mg (60 mg Oral Given 02/11/19 1940)  famotidine (PEPCID) tablet 20 mg (20 mg Oral Given 02/11/19 1940)     Initial Impression / Assessment and Plan / ED Course  I have reviewed the triage vital signs and the nursing notes.  Pertinent labs & imaging results that were available during my care of the patient were reviewed by me and considered in my medical decision making (see chart for details).  Clinical Course as of Feb 11 2052  Fri Feb 11, 2019  2030 Observed 4+ hours after initial reaction without complaint and with complete resolution of symptoms. Stable for discharge.    [AM]    Clinical Course User Index [AM] Albesa Seen, PA-C       This is a well-appearing 28 year old female with a past medical history cervical cancer presenting for possible allergic reaction.  It occurred within 15 to 20 minutes of ingesting clindamycin.  Patient is likely criteria for anaphylaxis as she only has 1 organ system involved.  Clinically emergency part remained hemodynamically stable, with resolving skin lesions and no evidence of progressing allergic reaction.  She was observed well over 4 hours with no signs of  biphasic reaction.  Patient was given prednisone and Pepcid.  She is completing a Medrol Dosepak prescribed by her dentist, starting today.  Will have patient continue steroids and antihistamines.   Regarding patient's dental complaint evaluated by her dentist, there is no evidence of abscess.  No evidence of Ludwig's angina or deep space infection of the head or neck.  Case discussed with PharrnD who recommends Flagyl and cefuroxime for coverage given patient's multiple allergies.  Appreciate their involvement.  Patient was given return cautions for any return of rash, difficulty breathing, swallowing, fevers, or new or worsening symptoms.  Patient is in understanding and agrees with the plan of care.  Final Clinical Impressions(s) / ED Diagnoses   Final diagnoses:  Allergic reaction, initial encounter    ED Discharge Orders         Ordered    cefUROXime (CEFTIN) 500 MG tablet  2 times daily with meals     02/11/19 2029    metroNIDAZOLE (FLAGYL) 500 MG tablet  2 times daily     02/11/19 2029    chlorhexidine (PERIDEX) 0.12 % solution  2 times daily     02/11/19 2029           Tamala Julian 02/12/19 0032    Daleen Bo, MD 02/12/19 [phone]

## 2019-02-11 NOTE — ED Triage Notes (Signed)
Came in via ems; c/o potential allergic reaction to Clindamycin 300mg  ; pt stated took around 330pm and had symptoms 15-20 mins after. Reported PO benadryl 50 mg @ 1740.

## 2019-02-11 NOTE — ED Notes (Signed)
Noted pt's generalized redness/rash diminishing. Itching subsided as well. Monitoring continues.

## 2019-02-15 ENCOUNTER — Ambulatory Visit (INDEPENDENT_AMBULATORY_CARE_PROVIDER_SITE_OTHER): Payer: BLUE CROSS/BLUE SHIELD | Admitting: Adult Health

## 2019-02-15 ENCOUNTER — Encounter: Payer: Self-pay | Admitting: Adult Health

## 2019-02-15 ENCOUNTER — Other Ambulatory Visit: Payer: Self-pay

## 2019-02-15 ENCOUNTER — Telehealth: Payer: Self-pay | Admitting: Adult Health

## 2019-02-15 VITALS — BP 130/85 | Temp 98.9°F | Ht 68.0 in | Wt 221.0 lb

## 2019-02-15 DIAGNOSIS — Z889 Allergy status to unspecified drugs, medicaments and biological substances status: Secondary | ICD-10-CM | POA: Diagnosis not present

## 2019-02-15 NOTE — Progress Notes (Signed)
Virtual Visit via Telephone Note  I connected with Rebecca Adkins on 02/15/19 at  4:15 PM EDT by telephone and verified that I am speaking with the correct person using two identifiers.  Location: Patient: Home Provider: In Clinic   I discussed the limitations, risks, security and privacy concerns of performing an evaluation and management service by telephone and the availability of in person appointments. I also discussed with the patient that there may be a patient responsible charge related to this service. The patient expressed understanding and agreed to proceed.  History of Present Illness: Rebecca Adkins calls in today for ED f/u: 02/11/2019 ED Notes: "HPI: Patient is a 28 yo female with a past medical history of cervical cancer presenting for allergy reaction. She reports beginning clindamycin today for dental pain prescribed by her dentist in the electronic visit.  She reports that approximately 15 to 20 minutes after ingesting clindamycin she experienced pruritus primarily on the extremities as well as feeling anxious and having palpitations.  She denies any sensation of throat closure, angioedema, wheezing, lightheadedness syncope or presyncope.  Patient denies any abdominal cramping, abdominal pain, nausea, vomiting, or diarrhea. Patient reports he took 50 mg of oral Benadryl before calling EMS.  Patient has an EpiPen at home but did not administer it.  Clindamycin is not a known allergen to patient but she does have multiple other antibiotic allergies.  Regarding dental pain she denies any difficulty breathing, swallowing, feeling of facial swelling or drainage in the mouth"  She was observed well over 4 hours with no signs of biphasic reaction.  Patient was given prednisone and Pepcid.  She is completing a Medrol Dosepak prescribed by her dentist, starting today.  Will have patient continue steroids and antihistamines.   Regarding patient's dental complaint evaluated by her dentist, there  is no evidence of abscess.  No evidence of Ludwig's angina or deep space infection of the head or neck.  Case discussed with PharrnD who recommends Flagyl and cefuroxime for coverage given patient's multiple allergies.  Appreciate their involvement.  Patient was given return cautions for any return of rash, difficulty breathing, swallowing, fevers, or new or worsening symptoms.  Patient is in understanding and agrees with the plan of care. She was started on Cefuroxime (Ceftin) 555m BID x 7 days, Metronidazole 5027mBID x 7 days, and Peridex 0.12% mouth wash"  Rebecca Adkins that her pharmacist recommend that she not take Ceftin due to her known PCN allergy.  Also the Flagy was only useful is taken with the Ceftin- so she has not taken either of these medications. She has taken the Steroids provided by her dentist, day 5 of 6 day course. She has been seen by her dentist earlier today- who now is very hesitant to treat her with any ABX due to extensive allergy/reaction hx. Explained that since I have not seen the area if infection I cannot treat, that should come from her dentist. Pt communicated understanding and agreement. Placed referral to Allergist. She denies any breathing issues/spreading rash. She denies N/V/D  Patient Care Team    Relationship Specialty Notifications Start End  DaMina Marble, NP PCP - General Family Medicine  10/05/18   Magrinat, GuVirgie DadMD Consulting Physician Oncology  07/26/18   XuLeandrew KoyanagiMD Attending Physician Orthopedic Surgery  07/26/18   MoAzucena FallenMD Consulting Physician Obstetrics and Gynecology  07/26/18   ClSharlotte AlamoDPM  Podiatry  10/05/18     Patient Active Problem List  Diagnosis Date Noted  . Weight gain 12/06/2018  . BMI 33.0-33.9,adult 10/05/2018  . Healthcare maintenance 10/05/2018  . At high risk for breast cancer 07/26/2018  . Family history of breast cancer   . Family history of ovarian cancer   . Family history of BRCA2 gene  positive   . Closed displaced fracture of left clavicle 08/19/2016     Past Medical History:  Diagnosis Date  . Cancer (HCC)    cervical  . Clavicle fracture    left  . Elevated liver enzymes   . Family history of BRCA2 gene positive   . Family history of breast cancer   . Family history of ovarian cancer   . GERD (gastroesophageal reflux disease)   . Headache   . Ovarian cyst   . PONV (postoperative nausea and vomiting)   . Seizures ([place])    as a child only     Past Surgical History:  Procedure Laterality Date  . BUNIONECTOMY    . DILATION AND CURETTAGE OF UTERUS    . HARDWARE REMOVAL     left clavicle  . LEEP    . ORIF CLAVICULAR FRACTURE Left 08/21/2016   Procedure: OPEN REDUCTION INTERNAL FIXATION (ORIF) LEFT CLAVICLE FRACTURE;  Surgeon: Leandrew Koyanagi, MD;  Location: [place];  Service: Orthopedics;  Laterality: Left;  . OVARY SURGERY       Family History  Problem Relation Age of Onset  . Hypertension Mother   . Ovarian cancer Mother 64  . Diabetes Other   . Hypertension Other   . COPD Other   . Other Other   . Breast cancer Sister 79  . BRCA 1/2 Sister        BRCA2 pos  . Hypertension Sister   . Colon cancer Paternal Aunt        dx over 69  . Hypertension Maternal Grandmother   . Diabetes Maternal Grandmother   . Kidney disease Maternal Grandmother   . Stroke Maternal Grandfather   . COPD Paternal Grandmother   . Other Paternal Uncle        infant death  . Breast cancer Other        PGMs sister  . Breast cancer Other        PGMs sister     Social History   Substance and Sexual Activity  Drug Use No     Social History   Substance and Sexual Activity  Alcohol Use Yes  . Alcohol/week: 13.0 standard drinks  . Types: 3 Glasses of wine, 10 Cans of beer per week     Social History   Tobacco Use  Smoking Status Current Every Day Smoker  . Packs/day: 1.00  . Years: 13.00  . Pack years: 13.00  . Types: Cigarettes  Smokeless Tobacco Never  Used  Tobacco Comment   PT STATES SHE QUIT SMOKING 1 WEEK AGO     Outpatient Encounter Medications as of 02/15/2019  Medication Sig  . chlorhexidine (PERIDEX) 0.12 % solution Use as directed 15 mLs in the mouth or throat 2 (two) times daily. Swish and spit.  Do not swallow.  . diphenhydrAMINE (BENADRYL) 25 MG tablet Take 25 mg by mouth every 6 (six) hours as needed.  Marland Kitchen EPINEPHrine 0.3 mg/0.3 mL IJ SOAJ injection   . famotidine (PEPCID) 10 MG tablet Take 10 mg by mouth 2 (two) times daily.  . methylPREDNISolone (MEDROL) 4 MG tablet Take 4 mg by mouth. Taper dose  . norethindrone-ethinyl estradiol-iron (BLISOVI FE  1.5/30) 1.5-30 MG-MCG tablet Take 1 tablet by mouth daily.  Marland Kitchen oxyCODONE-acetaminophen (PERCOCET/ROXICET) 5-325 MG tablet Take 1 tablet by mouth every 4 (four) hours as needed for severe pain.  . cefUROXime (CEFTIN) 500 MG tablet Take 1 tablet (500 mg total) by mouth 2 (two) times daily with a meal for 7 days. (Patient not taking: Reported on 02/15/2019)  . metroNIDAZOLE (FLAGYL) 500 MG tablet Take 1 tablet (500 mg total) by mouth 2 (two) times daily. (Patient not taking: Reported on 02/15/2019)  . [DISCONTINUED] Multiple Vitamin (MULTIVITAMIN) tablet Take 1 tablet by mouth daily.   No facility-administered encounter medications on file as of 02/15/2019.     Allergies: Articaine; Sodium bisulfate; Amoxicillin; Clindamycin/lincomycin; Nickel; Penicillins; and Tramadol  Body mass index is 33.6 kg/m.  Blood pressure 130/85, temperature 98.9 F (37.2 C), temperature source Oral, height _0  (1.727 m), weight 221 lb (100.2 kg).   Review of Systems: General:   Denies fever, chills, unexplained weight loss.  Optho/Auditory:   Denies visual changes, blurred vision/LOV Respiratory:   Denies SOB, DOE more than baseline levels.  Cardiovascular:   Denies chest pain, palpitations, new onset peripheral edema  Gastrointestinal:   Denies nausea, vomiting, diarrhea.  Genitourinary: Denies  dysuria, freq/ urgency, flank pain or discharge from genitals.  Endocrine:     Denies hot or cold intolerance, polyuria, polydipsia. Musculoskeletal:   Denies unexplained myalgias, joint swelling, unexplained arthralgias, gait problems.  Skin:  Rash +, suspicious lesions Neurological:     Denies dizziness, unexplained weakness, numbness  Psychiatric/Behavioral:   Denies mood changes, suicidal or homicidal ideations, hallucinations This patient does not have sx concerning for COVID-19 Infection (ie; fever, chills, cough, new or worsening shortness of breath).  Observations/Objective: No acute distress noted on the telephone.  Assessment and Plan: Hold all ABX's- speak with your dentist about plan of care. Complete course of Steroids Remain well hydrated. Referral to Allergist placed  Follow Up Instructions: PRN   I discussed the assessment and treatment plan with the patient. The patient was provided an opportunity to ask questions and all were answered. The patient agreed with the plan and demonstrated an understanding of the instructions.   The patient was advised to call back or seek an in-person evaluation if the symptoms worsen or if the condition fails to improve as anticipated.  I provided 18 minutes of non-face-to-face time during this encounter.   Esaw Grandchild, NP

## 2019-02-15 NOTE — Assessment & Plan Note (Signed)
  Assessment and Plan: Hold all ABX's- speak with your dentist about plan of care. Complete course of Steroids Remain well hydrated. Referral to Allergist placed  Follow Up Instructions: PRN   I discussed the assessment and treatment plan with the patient. The patient was provided an opportunity to ask questions and all were answered. The patient agreed with the plan and demonstrated an understanding of the instructions.   The patient was advised to call back or seek an in-person evaluation if the symptoms worsen or if the condition fails to improve as anticipated.

## 2019-02-24 ENCOUNTER — Ambulatory Visit: Payer: BC Managed Care – PPO | Admitting: Allergy

## 2019-02-24 ENCOUNTER — Other Ambulatory Visit: Payer: Self-pay

## 2019-02-24 ENCOUNTER — Encounter: Payer: Self-pay | Admitting: Allergy

## 2019-02-24 VITALS — BP 116/86 | HR 107 | Temp 98.7°F | Resp 16 | Ht 68.0 in | Wt 227.0 lb

## 2019-02-24 DIAGNOSIS — T50905D Adverse effect of unspecified drugs, medicaments and biological substances, subsequent encounter: Secondary | ICD-10-CM | POA: Diagnosis not present

## 2019-02-24 DIAGNOSIS — J3089 Other allergic rhinitis: Secondary | ICD-10-CM | POA: Diagnosis not present

## 2019-02-24 NOTE — Progress Notes (Signed)
New Patient Note  RE: Rebecca Adkins MRN: [id number] DOB: June 18, 1991 Date of Office Visit: 02/24/2019  Referring provider: Esaw Grandchild, NP Primary care provider: Esaw Grandchild, NP  Chief Complaint: Drug allergies  History of present illness: Rebecca Adkins is a 28 y.o. female presenting today for consultation for multiple drug allergies.  As a child she had a reaction to amoxicillin on one occasion and to a another penicillin on a separate occasion.   She states her mother told her she was hospitalized for 1 of the penicillin reactions.   She does not know exactly what her reaction was.  She has not taken any penicillin-based medication since she was a child.  She took clindamycin last September 2019 while in her dentist office after having a tooth pulled.  She states she had a "anaphylactic reaction".   Symptoms included mouth, lip, and tongue swelling, hives, loss vision and had syncopal event.  She also states they took her blood pressure and states that it was "low"..  She was administered her epinephrine in the office and was taken to ED for observation.  She did not require hospitalization.  At the time they thought reaction was related to the local anesthetic, Septocaine.    However she had clindamyin again 2 weeks ago and had similar but less severe reaction.  She states she took half the dose of clindamycin that she got at the dentist office.  She states she took the dose and became covered in hives.  She also had some lip and mouth swelling however did not have these syncopal episode.  Thus she feels and states that the dentist also agrees that the reaction she had at the dentist was likely related to clindamycin and less likely to the local anesthetic.  However the local anesthetic is listed as an allergy at this time. She does have a root canal planned for 2 weeks from today with a different dentist who states that he can use a another local anesthetic.  She states she had  clindamycin on several occasions before without any issue.  As a child she states that she could not use penicillin that she was normally treated with clindamycin without any issue.  Certain pain medication (tramodol and toradol) causes N/V and hives after ingestion.   Reports seasonal allergy symptoms of nasal congestion/drainage, sneezing that she will take claritin as needed for with good relief of symptoms.   Her dentist is Dr. Moss Mc on [address].  Review of systems: Review of Systems  Constitutional: Negative for chills, fever and malaise/fatigue.  HENT: Negative for congestion, ear discharge, nosebleeds and sore throat.   Eyes: Negative for pain, discharge and redness.  Respiratory: Negative for cough, shortness of breath and wheezing.   Cardiovascular: Negative for chest pain.  Gastrointestinal: Negative for abdominal pain, constipation, diarrhea, heartburn, nausea and vomiting.  Musculoskeletal: Negative for joint pain.  Skin: Positive for itching and rash.  Neurological: Negative for headaches.    All other systems negative unless noted above in HPI  Past medical history: Past Medical History:  Diagnosis Date  . Cancer (HCC)    cervical  . Clavicle fracture    left  . Elevated liver enzymes   . Family history of BRCA2 gene positive   . Family history of breast cancer   . Family history of ovarian cancer   . GERD (gastroesophageal reflux disease)   . Headache   . Ovarian cyst   . PONV (postoperative nausea  and vomiting)   . Seizures ([place])    as a child only    Past surgical history: Past Surgical History:  Procedure Laterality Date  . BUNIONECTOMY    . DILATION AND CURETTAGE OF UTERUS    . HARDWARE REMOVAL     left clavicle  . LEEP    . ORIF CLAVICULAR FRACTURE Left 08/21/2016   Procedure: OPEN REDUCTION INTERNAL FIXATION (ORIF) LEFT CLAVICLE FRACTURE;  Surgeon: Leandrew Koyanagi, MD;  Location: [place];  Service: Orthopedics;  Laterality: Left;  . OVARY  SURGERY      Family history:  Family History  Problem Relation Age of Onset  . Hypertension Mother   . Ovarian cancer Mother 4  . Diabetes Other   . Hypertension Other   . COPD Other   . Other Other   . Breast cancer Sister 83  . BRCA 1/2 Sister        BRCA2 pos  . Hypertension Sister   . Colon cancer Paternal Aunt        dx over 82  . Hypertension Maternal Grandmother   . Diabetes Maternal Grandmother   . Kidney disease Maternal Grandmother   . Stroke Maternal Grandfather   . COPD Paternal Grandmother   . Other Paternal Uncle        infant death  . Breast cancer Other        PGMs sister  . Breast cancer Other        PGMs sister    Social history: She lives in a home without carpeting with electric heating and central cooling.  There are dogs in the home.  There is no concern for water damage, mildew or roaches in the home.  She is a new business associate. Tobacco Use  . Smoking status: Current Every Day Smoker    Packs/day: 0.5-1.00    Years: 10    Types: Cigarettes  . Smokeless tobacco: Never Used    Medication List: Allergies as of 02/24/2019      Reactions   Articaine Anaphylaxis   Sodium Bisulfate Anaphylaxis   Amoxicillin Hives    " Hives and rash "   Clindamycin/lincomycin Hives   Nickel Hives   " hives and a rash"   Penicillins    Has patient had a PCN reaction causing immediate rash, facial/tongue/throat swelling, SOB or lightheadedness with hypotension: Yes Has patient had a PCN reaction causing severe rash involving mucus membranes or skin necrosis: No Has patient had a PCN reaction that required hospitalization No Has patient had a PCN reaction occurring within the last 10 years: No If all of the above answers are "NO", then may proceed with Cephalosporin use.   Tramadol       Medication List       Accurate as of February 24, 2019  6:21 PM. If you have any questions, ask your nurse or doctor.        STOP taking these medications    chlorhexidine 0.12 % solution Commonly known as:  PERIDEX Stopped by:  [name], MD   Medrol 4 MG tablet Generic drug:  methylPREDNISolone Stopped by:  [name] Charmian Muff, MD     TAKE these medications   azithromycin 250 MG tablet Commonly known as:  ZITHROMAX   Blisovi Fe 1.5/30 1.5-30 MG-MCG tablet Generic drug:  norethindrone-ethinyl estradiol-iron Take 1 tablet by mouth daily.   diphenhydrAMINE 25 MG tablet Commonly known as:  BENADRYL Take 25 mg by mouth every 6 (six) hours  as needed.   EPINEPHrine 0.3 mg/0.3 mL Soaj injection Commonly known as:  EPI-PEN   famotidine 10 MG tablet Commonly known as:  PEPCID Take 10 mg by mouth 2 (two) times daily.   metroNIDAZOLE 500 MG tablet Commonly known as:  FLAGYL Take 1 tablet (500 mg total) by mouth 2 (two) times daily.   oxyCODONE-acetaminophen 5-325 MG tablet Commonly known as:  PERCOCET/ROXICET Take 1 tablet by mouth every 4 (four) hours as needed for severe pain.       Known medication allergies: Allergies  Allergen Reactions  . Articaine Anaphylaxis  . Sodium Bisulfate Anaphylaxis  . Amoxicillin Hives     " Hives and rash "  . Clindamycin/Lincomycin Hives  . Nickel Hives    " hives and a rash"  . Penicillins     Has patient had a PCN reaction causing immediate rash, facial/tongue/throat swelling, SOB or lightheadedness with hypotension: Yes Has patient had a PCN reaction causing severe rash involving mucus membranes or skin necrosis: No Has patient had a PCN reaction that required hospitalization No Has patient had a PCN reaction occurring within the last 10 years: No If all of the above answers are "NO", then may proceed with Cephalosporin use.   . Tramadol      Physical examination: Blood pressure 116/86, pulse (!) 107, temperature 98.7 F (37.1 C), temperature source Tympanic, resp. rate 16, height _0  (1.727 m), weight 227 lb (103 kg), SpO2 96 %.  General: Alert,  interactive, in no acute distress. HEENT: PERRLA, TMs pearly gray, turbinates non-edematous without discharge, post-pharynx non erythematous. Neck: Supple without lymphadenopathy. Lungs: Clear to auscultation without wheezing, rhonchi or rales. {no increased work of breathing. CV: Normal S1, S2 without murmurs. Abdomen: Nondistended, nontender. Skin: Warm and dry, without lesions or rashes. Extremities:  No clubbing, cyanosis or edema. Neuro:   Grossly intact.  Diagnositics/Labs:  Allergy testing: Environmental allergy skin prick testing is negative with a positive histamine control. Intradermal testing is positive to Johnson grass and mold mix 1, 2, 3, 4. Allergy testing results were read and interpreted by provider, documented by clinical staff.   Assessment and plan: Adverse drug reaction  - history of childhood reaction to Penicillins.  Due to the amount of time that has passed since this reaction it is very unlikely that you are still allergic.  Thus would recommend performing graded Penicillin drug challenge.  For challenges hold all antihistamines for at least 3 days prior to challenge day.   Continue to avoid Penicillin antibiotics until able to be cleared  - recent anaphylaxis to Clindamycin.  Would continue to avoid Clindamycin at this time.    - I agree that your reaction at the dentist office was more likely related to Clindamycin administration and less likely to the local anesthetic used for numbing.  However to clear the Septicaine from your allergy list would recommend skin testing to this local anesthetic.  If you can get a vial of Septicaine WITHOUT Epinephrine from your dentist and bring to office we can perform testing.  Would need to avoid all antihistamines for 3 days prior to testing.    - symptoms following Toradol and Tramadol appear to be side effects related to the medication and not true drug allergy.  If these medications are needed would recommend pre-treatment  with antihistamine +/- antinausea medication   Allergic rhinitis - environmental allergy skin testing today is positive to Johnson grass pollen and molds - allergen avoidance measures discussed/handouts provided - for  general allergy symptom relief recommend use of long-acting antihistamine like Zyrtec 26m, Allegra 1840mor Xyzal 95m57maily as needed - for nasal congestion/drainage can use over-the-counter Flonase, Nasacort or Rhinocort 2 sprays each nostril daily as needed.   Follow-up for penicillin drug challenge in July or later  I appreciate the opportunity to take part in [facility]. Please do not hesitate to contact me with questions.  Sincerely,   ShaPrudy FeelerD Allergy/[facility]

## 2019-02-24 NOTE — Patient Instructions (Addendum)
Drug allergy   - history of childhood reaction to Penicillins.  Due to the amount of time that has passed since this reaction it is very unlikely that you are still allergic.  Thus would recommend performing graded Penicillin drug challenge.  For challenges hold all antihistamines for at least 3 days prior to challenge day.   Continue to avoid Penicillin antibiotics until able to be cleared  - recent anaphylaxis to Clindamycin.  Would continue to avoid Clindamycin at this time.    - I agree that your reaction at the dentist office was more likely related to Clindamycin administration and less likely to the local anesthetic used for numbing.  However to clear the Septicaine from your allergy list would recommend skin testing to this local anesthetic.  If you can get a vial of Septicaine WITHOUT Epinephrine from your dentist and bring to office we can perform testing.  Would need to avoid all antihistamines for 3 days prior to testing.    - symptoms following Toradol and Tramadol appear to be side effects related to the medication and not true drug allergy.  If these medications are needed would recommend pre-treatment with antihistamine +/- antinausea medication   Allergic rhinitis - environmental allergy skin testing today is positive to Johnson grass pollen and molds - allergen avoidance measures discussed/handouts provided - for general allergy symptom relief recommend use of long-acting antihistamine like Zyrtec 10mg , Allegra 180mg  or Xyzal 5mg  daily as needed - for nasal congestion/drainage can use over-the-counter Flonase, Nasacort or Rhinocort 2 sprays each nostril daily as needed.   Follow-up for penicillin drug challenge in July or later

## 2019-03-03 DIAGNOSIS — Z124 Encounter for screening for malignant neoplasm of cervix: Secondary | ICD-10-CM | POA: Diagnosis not present

## 2019-03-03 DIAGNOSIS — Z01419 Encounter for gynecological examination (general) (routine) without abnormal findings: Secondary | ICD-10-CM | POA: Diagnosis not present

## 2019-03-03 DIAGNOSIS — Z6834 Body mass index (BMI) 34.0-34.9, adult: Secondary | ICD-10-CM | POA: Diagnosis not present

## 2019-03-03 DIAGNOSIS — Z1151 Encounter for screening for human papillomavirus (HPV): Secondary | ICD-10-CM | POA: Diagnosis not present

## 2019-03-04 DIAGNOSIS — E559 Vitamin D deficiency, unspecified: Secondary | ICD-10-CM | POA: Diagnosis not present

## 2019-03-04 DIAGNOSIS — Z130 Encounter for screening for diseases of the blood and blood-forming organs and certain disorders involving the immune mechanism: Secondary | ICD-10-CM | POA: Diagnosis not present

## 2019-03-04 DIAGNOSIS — Z0000 Encounter for general adult medical examination without abnormal findings: Secondary | ICD-10-CM | POA: Diagnosis not present

## 2019-03-04 DIAGNOSIS — Z1389 Encounter for screening for other disorder: Secondary | ICD-10-CM | POA: Diagnosis not present

## 2019-03-04 DIAGNOSIS — Z13220 Encounter for screening for lipoid disorders: Secondary | ICD-10-CM | POA: Diagnosis not present

## 2019-03-04 DIAGNOSIS — Z1329 Encounter for screening for other suspected endocrine disorder: Secondary | ICD-10-CM | POA: Diagnosis not present

## 2019-03-21 DIAGNOSIS — B977 Papillomavirus as the cause of diseases classified elsewhere: Secondary | ICD-10-CM | POA: Diagnosis not present

## 2019-03-21 DIAGNOSIS — Z3202 Encounter for pregnancy test, result negative: Secondary | ICD-10-CM | POA: Diagnosis not present

## 2019-03-21 DIAGNOSIS — N870 Mild cervical dysplasia: Secondary | ICD-10-CM | POA: Diagnosis not present

## 2019-05-12 ENCOUNTER — Encounter (INDEPENDENT_AMBULATORY_CARE_PROVIDER_SITE_OTHER): Payer: Self-pay | Admitting: Bariatrics

## 2019-05-12 ENCOUNTER — Other Ambulatory Visit: Payer: Self-pay

## 2019-05-12 ENCOUNTER — Telehealth: Payer: Self-pay | Admitting: Adult Health

## 2019-05-12 ENCOUNTER — Ambulatory Visit (INDEPENDENT_AMBULATORY_CARE_PROVIDER_SITE_OTHER): Payer: BC Managed Care – PPO | Admitting: Bariatrics

## 2019-05-12 VITALS — BP 114/76 | HR 80 | Temp 98.7°F | Ht 68.0 in | Wt 225.0 lb

## 2019-05-12 DIAGNOSIS — R5383 Other fatigue: Secondary | ICD-10-CM

## 2019-05-12 DIAGNOSIS — E66811 Obesity, class 1: Secondary | ICD-10-CM

## 2019-05-12 DIAGNOSIS — E559 Vitamin D deficiency, unspecified: Secondary | ICD-10-CM | POA: Diagnosis not present

## 2019-05-12 DIAGNOSIS — E7849 Other hyperlipidemia: Secondary | ICD-10-CM

## 2019-05-12 DIAGNOSIS — E669 Obesity, unspecified: Secondary | ICD-10-CM

## 2019-05-12 DIAGNOSIS — F3289 Other specified depressive episodes: Secondary | ICD-10-CM

## 2019-05-12 DIAGNOSIS — R0602 Shortness of breath: Secondary | ICD-10-CM

## 2019-05-12 DIAGNOSIS — Z0289 Encounter for other administrative examinations: Secondary | ICD-10-CM

## 2019-05-12 DIAGNOSIS — Z6834 Body mass index (BMI) 34.0-34.9, adult: Secondary | ICD-10-CM

## 2019-05-12 DIAGNOSIS — K219 Gastro-esophageal reflux disease without esophagitis: Secondary | ICD-10-CM

## 2019-05-12 DIAGNOSIS — Z9189 Other specified personal risk factors, not elsewhere classified: Secondary | ICD-10-CM | POA: Diagnosis not present

## 2019-05-13 LAB — HEMOGLOBIN A1C
Est. average glucose Bld gHb Est-mCnc: 105 mg/dL
Hgb A1c MFr Bld: 5.3 % (ref 4.8–5.6)

## 2019-05-13 LAB — INSULIN, RANDOM: INSULIN: 14.4 u[IU]/mL (ref 2.6–24.9)

## 2019-05-16 ENCOUNTER — Encounter (INDEPENDENT_AMBULATORY_CARE_PROVIDER_SITE_OTHER): Payer: Self-pay | Admitting: Bariatrics

## 2019-05-16 DIAGNOSIS — E559 Vitamin D deficiency, unspecified: Secondary | ICD-10-CM | POA: Insufficient documentation

## 2019-05-16 DIAGNOSIS — R7401 Elevation of levels of liver transaminase levels: Secondary | ICD-10-CM | POA: Insufficient documentation

## 2019-05-16 NOTE — Telephone Encounter (Signed)
Please review

## 2019-05-17 NOTE — Progress Notes (Signed)
Office: [phone]  /  Fax: [phone]   Dear Rebecca Fuller D. Danford, NP,   Thank you for referring Rebecca Adkins to our clinic. The following note includes my evaluation and treatment recommendations.  HPI:   Chief Complaint: OBESITY    Rebecca Adkins has been referred by Rebecca Fuller D. Danford, NP for consultation regarding her obesity and obesity related comorbidities.    Rebecca Adkins (MR# [id number]) is a 28 y.o. female who presents on 05/12/2019 for obesity evaluation and treatment. Current BMI is Body mass index is 34.21 kg/m.Marland Kitchen Rebecca Adkins has been struggling with her weight for many years and has been unsuccessful in either losing weight, maintaining weight loss, or reaching her healthy weight goal.     Rebecca Adkins attended our information session and states she is currently in the action stage of change and ready to dedicate time achieving and maintaining a healthier weight. Rebecca Adkins is interested in becoming our patient and working on intensive lifestyle modifications including (but not limited to) diet, exercise and weight loss.    Rebecca Adkins states her family eats meals together she thinks her family will eat healthier with  her her desired weight loss is 40 lbs. she has been heavy most of her life she started gaining weight over the past 18 months to 2 yrs her heaviest weight ever was 237 lbs. She states that she likes to cook she craves junk food she snacks more in the mid-afternoon and night she does wake up hungry at night and she sometimes eats at night she skips meals frequently she is frequently drinking liquids with calories she frequently makes poor food choices she has problems with excessive hunger  she frequently eats larger portions than normal  she has binge eating behaviors she struggles with emotional eating    Rebecca Adkins feels her energy is lower than it should be. This has worsened with weight gain and has not worsened recently. Rebecca Adkins admits to daytime somnolence  and she admits to waking up still tired. Patient is at risk for obstructive sleep apnea. Patent has a history of symptoms of daytime Rebecca and morning Rebecca. Patient generally gets 6 to 8 hours of sleep per night, and states she has restful sleep half the time. Snoring is present. Apneic episodes are not present. Epworth Sleepiness Score is 8  Dyspnea on exertion Rebecca Adkins notes increasing shortness of breath with certain activities and seems to be worsening over time with weight gain. She notes getting out of breath sooner with activity than she used to. This has not gotten worse recently. Rebecca Adkins denies orthopnea.  Acid Reflux Rebecca Adkins has a diagnosis of acid reflux, and she is taking Pepcid.  Vitamin D deficiency Rebecca Adkins has a diagnosis of vitamin D deficiency. She is currently taking vit D (mega dose) and she denies nausea, vomiting or muscle weakness. Rebecca Adkins had labs in June;(CMP, CBC lipids) and TSH was at 1.48 and vitamin D level was at 13.7  At risk for osteopenia and osteoporosis Rebecca Adkins is at higher risk of osteopenia and osteoporosis due to vitamin D deficiency.   Elevated Cholesterol Rebecca Adkins has elevated cholesterol and her total cholesterol  Is 216, triglycerides are 237 and LDL is 47. She is attempting to improve her cholesterol levels with intensive lifestyle modification including a low saturated fat diet, exercise and weight loss. She denies myalgias.  Depression with emotional eating behaviors Rebecca Adkins is struggling with emotional eating and using food for comfort to the extent that it is negatively impacting her health. She often snacks when  she is not hungry. Rebecca Adkins sometimes feels she is out of control and then feels guilty that she made poor food choices. She is attempting to work on behavior modification techniques to help reduce her emotional eating. She shows no sign of suicidal or homicidal ideations. Appointment will be scheduled with Dr. Mallie Mussel (bariatric  psychologist).  Depression Screen Rebecca Adkins Food and Mood (modified PHQ-9) score was  Depression screen PHQ 2/9 05/12/2019  Decreased Interest 3  Down, Depressed, Hopeless 2  PHQ - 2 Score 5  Altered sleeping 2  Tired, decreased energy 3  Change in appetite 2  Feeling bad or failure about yourself  1  Trouble concentrating 2  Moving slowly or fidgety/restless 1  Suicidal thoughts 0  PHQ-9 Score 16  Difficult doing work/chores Somewhat difficult    ASSESSMENT AND PLAN:  Other Rebecca - Plan: EKG 12-Lead, Hemoglobin A1c, Insulin, random  SOB (shortness of breath) on exertion  Gastroesophageal reflux disease, esophagitis presence not specified  Vitamin D deficiency  Other hyperlipidemia  Other depression - with emotional eating  At risk for osteoporosis  Class 1 obesity with serious comorbidity and body mass index (BMI) of 34.0 to 34.9 in adult, unspecified obesity type  PLAN:  Rebecca Adkins was informed that her Rebecca may be related to obesity, depression or many other causes. Labs will be ordered, and in the meanwhile Rebecca Adkins has agreed to work on diet, exercise and weight loss to help with Rebecca. Proper sleep hygiene was discussed including the need for 7-8 hours of quality sleep each night. A sleep study was not ordered based on symptoms and Epworth score.  Dyspnea on exertion Rebecca Adkins's shortness of breath appears to be obesity related and exercise induced. She has agreed to work on weight loss and gradually increase exercise to treat her exercise induced shortness of breath. If Rebecca Adkins follows our instructions and loses weight without improvement of her shortness of breath, we will plan to refer to pulmonology. We will monitor this condition regularly. Rebecca Adkins agrees to this plan.  Acid Reflux Rebecca Adkins will continue her medications and she was advised that her acid reflux will improve with weight loss.  Vitamin D Deficiency Rebecca Adkins was informed that low vitamin  D levels contributes to Rebecca and are associated with obesity, breast, and colon cancer. She will continue to take prescription Vit D '@50'$ ,000 IU every week and she will follow up for routine testing of vitamin D, at least 2-3 times per year. She was informed of the risk of over-replacement of vitamin D and agrees to not increase her dose unless she discusses this with [place] first.  At risk for osteopenia and osteoporosis Isabella was given extended  (15 minutes) osteoporosis prevention counseling today. Rebecca Adkins is at risk for osteopenia and osteoporosis due to her vitamin D deficiency. She was encouraged to take her vitamin D and follow her higher calcium diet and increase strengthening exercise to help strengthen her bones and decrease her risk of osteopenia and osteoporosis.  Elevated Cholesterol Rebecca Adkins was informed of the American Heart Association Guidelines emphasizing intensive lifestyle modifications as the first line treatment for elevated cholesterol. We discussed many lifestyle modifications today in depth, and Rebecca Adkins will begin to work on decreasing saturated fats such as fatty red meat, butter and many fried foods. She will also increase vegetables and lean protein in her diet and work on exercise and weight loss efforts.  Depression with Emotional Eating Behaviors We discussed behavior modification techniques today to help Rebecca Adkins deal with her emotional  eating and depression. We will refer patient to Dr. Mallie Mussel our bariatric psychologist.  Depression Screen Rebecca Adkins had a strongly positive depression screening. Depression is commonly associated with obesity and often results in emotional eating behaviors. We will monitor this closely and work on CBT to help improve the non-hunger eating patterns.   Obesity Rebecca Adkins is currently in the action stage of change and her goal is to continue with weight loss efforts. I recommend Rebecca Adkins begin the structured treatment plan as follows:  She has  agreed to follow the category 3 plan +100 calories  Rebecca Adkins has been instructed to eventually work up to a goal of 150 minutes of combined cardio and strengthening exercise per week for weight loss and overall health benefits. We discussed the following Behavioral Modification Strategies today: planning for success, increase H2O intake, stop soda, decrease any sugary beverage, no skipping meals, keeping healthy foods in the home. increasing lean protein intake, decreasing simple carbohydrates , increasing vegetables, decrease eating out and work on meal planning and intentional eating   She was informed of the importance of frequent follow up visits to maximize her success with intensive lifestyle modifications for her multiple health conditions. She was informed we would discuss her lab results at her next visit unless there is a critical issue that needs to be addressed sooner. Normalee agreed to keep her next visit at the agreed upon time to discuss these results.  ALLERGIES: Allergies  Allergen Reactions  . Articaine Anaphylaxis  . Sodium Bisulfate Anaphylaxis  . Amoxicillin Hives     " Hives and rash "  . Clindamycin/Lincomycin Hives  . Nickel Hives    " hives and a rash"  . Penicillins     Has patient had a PCN reaction causing immediate rash, facial/tongue/throat swelling, SOB or lightheadedness with hypotension: Yes Has patient had a PCN reaction causing severe rash involving mucus membranes or skin necrosis: No Has patient had a PCN reaction that required hospitalization No Has patient had a PCN reaction occurring within the last 10 years: No If all of the above answers are "NO", then may proceed with Cephalosporin use.   . Tramadol     MEDICATIONS: Current Outpatient Medications on File Prior to Visit  Medication Sig Dispense Refill  . famotidine (PEPCID) 10 MG tablet Take 10 mg by mouth 2 (two) times daily.    . folic acid (FOLVITE) 1 MG tablet Take 1 mg by mouth daily.     . Vitamin D, Ergocalciferol, (DRISDOL) 1.25 MG (50000 UT) CAPS capsule Take 50,000 Units by mouth every 7 (seven) days.     No current facility-administered medications on file prior to visit.     PAST MEDICAL HISTORY: Past Medical History:  Diagnosis Date  . Back pain   . Cancer (HCC)    cervical  . Cervical dysplasia   . Clavicle fracture    left  . Elevated liver enzymes   . Family history of BRCA2 gene positive   . Family history of breast cancer   . Family history of ovarian cancer   . GERD (gastroesophageal reflux disease)   . Headache   . High cholesterol   . Joint pain   . Ovarian cyst   . PONV (postoperative nausea and vomiting)   . Seizures ([place])    as a child only  . Vitamin D deficiency     PAST SURGICAL HISTORY: Past Surgical History:  Procedure Laterality Date  . BUNIONECTOMY    . DILATION AND CURETTAGE  OF UTERUS    . HARDWARE REMOVAL     left clavicle  . LEEP    . ORIF CLAVICULAR FRACTURE Left 08/21/2016   Procedure: OPEN REDUCTION INTERNAL FIXATION (ORIF) LEFT CLAVICLE FRACTURE;  Surgeon: Leandrew Koyanagi, MD;  Location: [facility];  Service: Orthopedics;  Laterality: Left;  . OVARY SURGERY      SOCIAL HISTORY: Social History   Tobacco Use  . Smoking status: Current Every Day Smoker    Packs/day: 1.00    Years: 13.00    Pack years: 13.00    Types: Cigarettes  . Smokeless tobacco: Never Used  . Tobacco comment: PT STATES SHE QUIT SMOKING 1 WEEK AGO  Substance Use Topics  . Alcohol use: Yes    Alcohol/week: 13.0 standard drinks    Types: 3 Glasses of wine, 10 Cans of beer per week  . Drug use: No    FAMILY HISTORY: Family History  Problem Relation Age of Onset  . Hypertension Mother   . Ovarian cancer Mother 46  . Obesity Mother   . Diabetes Other   . Hypertension Other   . COPD Other   . Other Other   . Breast cancer Sister 41  . BRCA 1/2 Sister        BRCA2 pos  . Hypertension Sister   . Colon cancer Paternal Aunt        dx over 6   . Hypertension Maternal Grandmother   . Diabetes Maternal Grandmother   . Kidney disease Maternal Grandmother   . Stroke Maternal Grandfather   . COPD Paternal Grandmother   . Other Paternal Uncle        infant death  . Breast cancer Other        PGMs sister  . Breast cancer Other        PGMs sister    ROS: Review of Systems  Constitutional: Positive for malaise/Rebecca.  Respiratory: Positive for shortness of breath (on exertion).   Cardiovascular: Negative for orthopnea.  Gastrointestinal: Positive for constipation, diarrhea, heartburn and nausea. Negative for vomiting.  Musculoskeletal: Positive for back pain. Negative for myalgias.       Positive for muscle or joint pain  Endo/Heme/Allergies: Bruises/bleeds easily.  Psychiatric/Behavioral: Positive for depression. Negative for suicidal ideas.    PHYSICAL EXAM: Blood pressure 114/76, pulse 80, temperature 98.7 F (37.1 C), temperature source Oral, height '5\' 8"'$  (1.727 m), weight 225 lb (102.1 kg), last menstrual period 04/22/1999, SpO2 99 %. Body mass index is 34.21 kg/m. Physical Exam Vitals signs reviewed.  Constitutional:      Appearance: Normal appearance. She is well-developed. She is obese.  HENT:     Head: Normocephalic and atraumatic.     Nose: Nose normal.  Eyes:     General: No scleral icterus.    Extraocular Movements: Extraocular movements intact.  Neck:     Musculoskeletal: Normal range of motion and neck supple.     Thyroid: No thyromegaly.  Cardiovascular:     Rate and Rhythm: Normal rate and regular rhythm.  Pulmonary:     Effort: Pulmonary effort is normal. No respiratory distress.  Abdominal:     Palpations: Abdomen is soft.     Tenderness: There is no abdominal tenderness.  Musculoskeletal: Normal range of motion.     Comments: Range of Motion normal in all 4 extremities  Skin:    General: Skin is warm and dry.  Neurological:     Mental Status: She is alert and oriented to person,  place,  and time.     Coordination: Coordination normal.  Psychiatric:        Mood and Affect: Mood normal.        Behavior: Behavior normal.        Thought Content: Thought content does not include homicidal or suicidal ideation.     RECENT LABS AND TESTS: BMET No results found for: NA, K, CL, CO2, GLUCOSE, BUN, CREATININE, CALCIUM, GFRNONAA, GFRAA Lab Results  Component Value Date   HGBA1C 5.3 05/12/2019   Lab Results  Component Value Date   INSULIN 14.4 05/12/2019   CBC    Component Value Date/Time   WBC 7.8 08/21/2016 1036   RBC 4.05 08/21/2016 1036   HGB 12.9 08/21/2016 1036   HCT 38.1 08/21/2016 1036   PLT 274 08/21/2016 1036   MCV 94.1 08/21/2016 1036   MCH 31.9 08/21/2016 1036   MCHC 33.9 08/21/2016 1036   RDW 12.8 08/21/2016 1036   Iron/TIBC/Ferritin/ %Sat No results found for: IRON, TIBC, FERRITIN, IRONPCTSAT Lipid Panel  No results found for: CHOL, TRIG, HDL, CHOLHDL, VLDL, LDLCALC, LDLDIRECT Hepatic Function Panel  No results found for: PROT, ALBUMIN, AST, ALT, ALKPHOS, BILITOT, BILIDIR, IBILI    Component Value Date/Time   TSH 2.500 12/06/2018 1359    INDIRECT CALORIMETER done today shows a VO2 of 303 and a REE of 2108.  Her calculated basal metabolic rate is 0919 thus her basal metabolic rate is better than expected.       OBESITY BEHAVIORAL INTERVENTION VISIT  Today's visit was # 1   Starting weight: 225 lbs Starting date: 05/12/2019 Today's weight : 225 lbs Today's date: 05/12/2019 Total lbs lost to date: 0    05/12/2019  Height '5\' 8"'$  (1.727 m)  Weight 225 lb (102.1 kg)  BMI (Calculated) 34.22  BLOOD PRESSURE - SYSTOLIC 802  BLOOD PRESSURE - DIASTOLIC 76  Waist Measurement  45 inches   Body Fat % 43.9 %  Total Body Water (lbs) 89.6 lbs  RMR 2108    ASK: We discussed the diagnosis of obesity with Tempie Donning today and Cassi agreed to give [place] permission to discuss obesity behavioral modification therapy today.  ASSESS: Afrah has  the diagnosis of obesity and her BMI today is 34.22 Rayli is in the action stage of change   ADVISE: Romell was educated on the multiple health risks of obesity as well as the benefit of weight loss to improve her health. She was advised of the need for long term treatment and the importance of lifestyle modifications to improve her current health and to decrease her risk of future health problems.  AGREE: Multiple dietary modification options and treatment options were discussed and  Roselia agreed to follow the recommendations documented in the above note.  ARRANGE: Sydnie was educated on the importance of frequent visits to treat obesity as outlined per CMS and USPSTF guidelines and agreed to schedule her next follow up appointment today.  Corey Skains, am acting as Location manager for [employer]. Owens Shark, DO  I have reviewed the above documentation for accuracy and completeness, and I agree with the above. -Jearld Lesch, DO
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
## 2019-05-18 ENCOUNTER — Encounter (INDEPENDENT_AMBULATORY_CARE_PROVIDER_SITE_OTHER): Payer: Self-pay | Admitting: Bariatrics

## 2019-05-24 ENCOUNTER — Ambulatory Visit (INDEPENDENT_AMBULATORY_CARE_PROVIDER_SITE_OTHER): Payer: BC Managed Care – PPO | Admitting: Psychology

## 2019-05-24 ENCOUNTER — Other Ambulatory Visit: Payer: Self-pay

## 2019-05-24 DIAGNOSIS — F3289 Other specified depressive episodes: Secondary | ICD-10-CM | POA: Diagnosis not present

## 2019-05-26 ENCOUNTER — Other Ambulatory Visit: Payer: Self-pay

## 2019-05-26 ENCOUNTER — Encounter (INDEPENDENT_AMBULATORY_CARE_PROVIDER_SITE_OTHER): Payer: Self-pay | Admitting: Bariatrics

## 2019-05-26 ENCOUNTER — Ambulatory Visit (INDEPENDENT_AMBULATORY_CARE_PROVIDER_SITE_OTHER): Payer: BC Managed Care – PPO | Admitting: Bariatrics

## 2019-05-26 VITALS — BP 121/75 | HR 81 | Temp 98.5°F | Ht 68.0 in | Wt 221.0 lb

## 2019-05-26 DIAGNOSIS — R740 Nonspecific elevation of levels of transaminase and lactic acid dehydrogenase [LDH]: Secondary | ICD-10-CM

## 2019-05-26 DIAGNOSIS — R7401 Elevation of levels of liver transaminase levels: Secondary | ICD-10-CM

## 2019-05-26 DIAGNOSIS — E669 Obesity, unspecified: Secondary | ICD-10-CM

## 2019-05-26 DIAGNOSIS — E559 Vitamin D deficiency, unspecified: Secondary | ICD-10-CM | POA: Diagnosis not present

## 2019-05-26 DIAGNOSIS — E8881 Metabolic syndrome: Secondary | ICD-10-CM

## 2019-05-26 DIAGNOSIS — E7800 Pure hypercholesterolemia, unspecified: Secondary | ICD-10-CM

## 2019-05-26 DIAGNOSIS — Z6833 Body mass index (BMI) 33.0-33.9, adult: Secondary | ICD-10-CM

## 2019-05-31 NOTE — Progress Notes (Signed)
Office: [phone]  /  Fax: [phone]   HPI:   Chief Complaint: OBESITY Rebecca Adkins is here to discuss her progress with her obesity treatment plan. She is on the Category 3 plan +100 calories and is following her eating plan approximately 90 % of the time. She states she is exercising 0 minutes 0 times per week. Rebecca Adkins is down 4 pounds. Rebecca Adkins states that it was "somewhat hard". She is drinking more water and she is decreasing sugary drinks. Her weight is 221 lb (100.2 kg) today and has had a weight loss of 4 pounds over a period of 2 weeks since her last visit. She has lost 4 lbs since starting treatment with [place].  Vitamin D deficiency Rebecca Adkins has a diagnosis of vitamin D deficiency. She is currently taking vit D and her last vitamin D level was at 13.7 (6/20). She denies nausea, vomiting or muscle weakness.  Insulin Resistance (mild) Rebecca Adkins has a diagnosis of insulin resistance based on her elevated fasting insulin level >5. Although Rebecca Adkins's blood glucose readings are still under good control, insulin resistance puts her at greater risk of metabolic syndrome and diabetes. Her last A1c was at 5.3 and last insulin level was at 14.4. She is not taking metformin currently and continues to work on diet and exercise to decrease risk of diabetes. Rebecca Adkins denies polyphagia.  Elevated ALT  Rebecca Adkins has a new dx of elevated ALT of 48 (6/20). She denies abdominal pain.  Elevated Cholesterol Rebecca Adkins has elevated cholesterol and she has been working on lowering fats in her diet. Her cholesterol is 216, LDL is 99, triglycerides are 237 and HDL is 70. She denies any chest pain or myalgias.  ASSESSMENT AND PLAN:  Vitamin D deficiency  Insulin resistance - mild  Elevated alanine aminotransferase (ALT) level  Elevated cholesterol  Class 1 obesity with serious comorbidity and body mass index (BMI) of 33.0 to 33.9 in adult, unspecified obesity type  PLAN:  Vitamin D Deficiency Rebecca Adkins was  informed that low vitamin D levels contributes to fatigue and are associated with obesity, breast, and colon cancer. She agrees to continue to take prescription Vit D '@50'$ ,000 IU every week and will follow up for routine testing of vitamin D, at least 2-3 times per year. She was informed of the risk of over-replacement of vitamin D and agrees to not increase her dose unless she discusses this with [place] first.  Insulin Resistance (mild) Rebecca Adkins will continue to work on weight loss, exercise, increasing lean protein and decreasing simple carbohydrates in her diet to help decrease the risk of diabetes. She was informed that eating too many simple carbohydrates or too many calories at one sitting increases the likelihood of GI side effects.  Rebecca Adkins agreed to follow up with [place] as directed to monitor her progress.  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 Rebecca Adkins was educated on her risk of developing NASH or even liver failure and the only proven treatment for NAFLD was weight loss. We will check labs in the future and Rebecca Adkins agreed to continue with her weight loss efforts, cardio and resistance exercise as an essential part of her treatment plan.   Elevated Cholesterol Rebecca Adkins was informed of the American Heart Association Guidelines emphasizing intensive lifestyle modifications as the first line treatment for hyperlipidemia. We discussed many lifestyle modifications today in depth, and Rebecca Adkins will continue to work on decreasing saturated fats such as fatty  red meat, butter and many fried foods. She will also increase MUFA's, PUFA's, vegetables and lean protein in her diet and continue to work on exercise and weight loss efforts.  I spent > than 50% of the 15 minute visit on counseling as documented in the note.  Obesity Rebecca Adkins is currently in the action stage of change. As such, her goal is to continue with  weight loss efforts She has agreed to follow the Category 3 plan Rebecca Adkins will ponder exercise and we will discuss at the next visit. We discussed the following Behavioral Modification Strategies today: planning for success, increase H2O intake to 64 ounces, no skipping meals, keeping healthy foods in the home, increasing lean protein intake, decreasing simple carbohydrates, increasing vegetables, decrease eating out and work on meal planning and intentional eating  Rebecca Adkins has agreed to follow up with our clinic in 2 weeks. She was informed of the importance of frequent follow up visits to maximize her success with intensive lifestyle modifications for her multiple health conditions.  ALLERGIES: Allergies  Allergen Reactions  . Articaine Anaphylaxis  . Sodium Bisulfate Anaphylaxis  . Amoxicillin Hives     " Hives and rash "  . Clindamycin/Lincomycin Hives  . Nickel Hives    " hives and a rash"  . Penicillins     Has patient had a PCN reaction causing immediate rash, facial/tongue/throat swelling, SOB or lightheadedness with hypotension: Yes Has patient had a PCN reaction causing severe rash involving mucus membranes or skin necrosis: No Has patient had a PCN reaction that required hospitalization No Has patient had a PCN reaction occurring within the last 10 years: No If all of the above answers are "NO", then may proceed with Cephalosporin use.   . Tramadol     MEDICATIONS: Current Outpatient Medications on File Prior to Visit  Medication Sig Dispense Refill  . famotidine (PEPCID) 10 MG tablet Take 10 mg by mouth 2 (two) times daily.    . folic acid (FOLVITE) 1 MG tablet Take 1 mg by mouth daily.    . Vitamin D, Ergocalciferol, (DRISDOL) 1.25 MG (50000 UT) CAPS capsule Take 50,000 Units by mouth every 7 (seven) days.     No current facility-administered medications on file prior to visit.     PAST MEDICAL HISTORY: Past Medical History:  Diagnosis Date  . Back pain   . Cancer  (HCC)    cervical  . Cervical dysplasia   . Clavicle fracture    left  . Elevated liver enzymes   . Family history of BRCA2 gene positive   . Family history of breast cancer   . Family history of ovarian cancer   . GERD (gastroesophageal reflux disease)   . Headache   . High cholesterol   . Joint pain   . Ovarian cyst   . PONV (postoperative nausea and vomiting)   . Seizures ([place])    as a child only  . Vitamin D deficiency     PAST SURGICAL HISTORY: Past Surgical History:  Procedure Laterality Date  . BUNIONECTOMY    . DILATION AND CURETTAGE OF UTERUS    . HARDWARE REMOVAL     left clavicle  . LEEP    . ORIF CLAVICULAR FRACTURE Left 08/21/2016   Procedure: OPEN REDUCTION INTERNAL FIXATION (ORIF) LEFT CLAVICLE FRACTURE;  Surgeon: Leandrew Koyanagi, MD;  Location: [facility];  Service: Orthopedics;  Laterality: Left;  . OVARY SURGERY      SOCIAL HISTORY: Social History   Tobacco  Use  . Smoking status: Current Every Day Smoker    Packs/day: 1.00    Years: 13.00    Pack years: 13.00    Types: Cigarettes  . Smokeless tobacco: Never Used  . Tobacco comment: PT STATES SHE QUIT SMOKING 1 WEEK AGO  Substance Use Topics  . Alcohol use: Yes    Alcohol/week: 13.0 standard drinks    Types: 3 Glasses of wine, 10 Cans of beer per week  . Drug use: No    FAMILY HISTORY: Family History  Problem Relation Age of Onset  . Hypertension Mother   . Ovarian cancer Mother 61  . Obesity Mother   . Diabetes Other   . Hypertension Other   . COPD Other   . Other Other   . Breast cancer Sister 74  . BRCA 1/2 Sister        BRCA2 pos  . Hypertension Sister   . Colon cancer Paternal Aunt        dx over 69  . Hypertension Maternal Grandmother   . Diabetes Maternal Grandmother   . Kidney disease Maternal Grandmother   . Stroke Maternal Grandfather   . COPD Paternal Grandmother   . Other Paternal Uncle        infant death  . Breast cancer Other        PGMs sister  . Breast cancer Other         PGMs sister    ROS: Review of Systems  Constitutional: Positive for weight loss.  Cardiovascular: Negative for chest pain.  Gastrointestinal: Negative for abdominal pain, nausea and vomiting.  Musculoskeletal: Negative for myalgias.       Negative for muscle weakness  Endo/Heme/Allergies:       Negative for polyphagia    PHYSICAL EXAM: Blood pressure 121/75, pulse 81, temperature 98.5 F (36.9 C), temperature source Oral, height '5\' 8"'$  (1.727 m), weight 221 lb (100.2 kg), last menstrual period 05/18/2019, SpO2 97 %. Body mass index is 33.6 kg/m. Physical Exam Vitals signs reviewed.  Constitutional:      Appearance: Normal appearance. She is well-developed. She is obese.  Cardiovascular:     Rate and Rhythm: Normal rate.  Pulmonary:     Effort: Pulmonary effort is normal.  Musculoskeletal: Normal range of motion.  Skin:    General: Skin is warm and dry.  Neurological:     Mental Status: She is alert and oriented to person, place, and time.  Psychiatric:        Mood and Affect: Mood normal.        Behavior: Behavior normal.     RECENT LABS AND TESTS: BMET No results found for: NA, K, CL, CO2, GLUCOSE, BUN, CREATININE, CALCIUM, GFRNONAA, GFRAA Lab Results  Component Value Date   HGBA1C 5.3 05/12/2019   Lab Results  Component Value Date   INSULIN 14.4 05/12/2019   CBC    Component Value Date/Time   WBC 7.8 08/21/2016 1036   RBC 4.05 08/21/2016 1036   HGB 12.9 08/21/2016 1036   HCT 38.1 08/21/2016 1036   PLT 274 08/21/2016 1036   MCV 94.1 08/21/2016 1036   MCH 31.9 08/21/2016 1036   MCHC 33.9 08/21/2016 1036   RDW 12.8 08/21/2016 1036   Iron/TIBC/Ferritin/ %Sat No results found for: IRON, TIBC, FERRITIN, IRONPCTSAT Lipid Panel  No results found for: CHOL, TRIG, HDL, CHOLHDL, VLDL, LDLCALC, LDLDIRECT Hepatic Function Panel  No results found for: PROT, ALBUMIN, AST, ALT, ALKPHOS, BILITOT, BILIDIR, IBILI    Component  Value Date/Time   TSH 2.500  12/06/2018 1359      OBESITY BEHAVIORAL INTERVENTION VISIT  Today's visit was # 2   Starting weight: 225 lbs Starting date: 05/12/2019 Today's weight : 221 lbs  Today's date: 05/26/2019 Total lbs lost to date: 4    05/26/2019  Height '5\' 8"'$  (1.727 m)  Weight 221 lb (100.2 kg)  BMI (Calculated) 33.61  BLOOD PRESSURE - SYSTOLIC 148  BLOOD PRESSURE - DIASTOLIC 75   Body Fat % 40.3 %  Total Body Water (lbs) 89.2 lbs    ASK: We discussed the diagnosis of obesity with Tempie Donning today and Shanikka agreed to give [place] permission to discuss obesity behavioral modification therapy today.  ASSESS: Rosea has the diagnosis of obesity and her BMI today is 33.61 Ioma is in the action stage of change   ADVISE: Darely was educated on the multiple health risks of obesity as well as the benefit of weight loss to improve her health. She was advised of the need for long term treatment and the importance of lifestyle modifications to improve her current health and to decrease her risk of future health problems.  AGREE: Multiple dietary modification options and treatment options were discussed and  Kobe agreed to follow the recommendations documented in the above note.  ARRANGE: Solstice was educated on the importance of frequent visits to treat obesity as outlined per CMS and USPSTF guidelines and agreed to schedule her next follow up appointment today.  Corey Skains, am acting as Location manager for [employer]. Owens Shark, DO  I have reviewed the above documentation for accuracy and completeness, and I agree with the above. -Jearld Lesch, DO

## 2019-06-02 DIAGNOSIS — M25362 Other instability, left knee: Secondary | ICD-10-CM | POA: Diagnosis not present

## 2019-06-02 DIAGNOSIS — M25562 Pain in left knee: Secondary | ICD-10-CM | POA: Diagnosis not present

## 2019-06-06 ENCOUNTER — Encounter (INDEPENDENT_AMBULATORY_CARE_PROVIDER_SITE_OTHER): Payer: Self-pay | Admitting: Bariatrics

## 2019-06-13 ENCOUNTER — Other Ambulatory Visit: Payer: Self-pay

## 2019-06-13 ENCOUNTER — Ambulatory Visit (INDEPENDENT_AMBULATORY_CARE_PROVIDER_SITE_OTHER): Payer: BC Managed Care – PPO | Admitting: Family Medicine

## 2019-06-13 VITALS — BP 118/83 | HR 96 | Temp 98.3°F | Ht 68.0 in | Wt 220.0 lb

## 2019-06-13 DIAGNOSIS — E8881 Metabolic syndrome: Secondary | ICD-10-CM | POA: Diagnosis not present

## 2019-06-13 DIAGNOSIS — F3289 Other specified depressive episodes: Secondary | ICD-10-CM | POA: Diagnosis not present

## 2019-06-13 DIAGNOSIS — Z9189 Other specified personal risk factors, not elsewhere classified: Secondary | ICD-10-CM

## 2019-06-13 DIAGNOSIS — Z6833 Body mass index (BMI) 33.0-33.9, adult: Secondary | ICD-10-CM

## 2019-06-13 DIAGNOSIS — E669 Obesity, unspecified: Secondary | ICD-10-CM

## 2019-06-13 MED ORDER — BUPROPION HCL ER (SR) 100 MG PO TB12: 100.0000 mg | ORAL_TABLET | Freq: Every day | ORAL | 0 refills | Status: DC

## 2019-06-15 NOTE — Progress Notes (Signed)
Office: [phone]  /  Fax: [phone]   HPI:   Chief Complaint: OBESITY Rebecca Adkins is here to discuss her progress with her obesity treatment plan. She is on the Category 3 plan +100 calories and is following her eating plan approximately 75 % of the time. She states she is exercising 0 minutes 0 times per week. Zena misses eating mayonnaise and high fat meats. She stopped soda completely and she has made many healthy changes. Her weight is 220 lb (99.8 kg) today and she has had a weight loss of 1 pound over a period of 2 weeks since her last visit. She has lost 5 lbs since starting treatment with [place].  Insulin Resistance Rebecca Adkins has a diagnosis of insulin resistance based on her elevated fasting insulin level >5. Although Rebecca Adkins's blood glucose readings are still under good control, insulin resistance puts her at greater risk of metabolic syndrome and diabetes. She is not taking metformin currently and continues to work on diet and exercise to decrease risk of diabetes. Rebecca Adkins admits to cravings throughout the day and she denies polyphagia. Lab Results  Component Value Date   HGBA1C 5.3 05/12/2019     At risk for diabetes Rebecca Adkins is at higher than average risk for developing diabetes due to her obesity and insulin resistance. She currently denies polyuria or polydipsia.  Depression with emotional eating behaviors Rebecca Adkins admits to cravings throughout the day. She works from home and she tends to do mindless snacking. Rebecca Adkins is seeing Dr. Mallie Mussel. [name] struggles with emotional eating and using food for comfort to the extent that it is negatively impacting her health. She often snacks when she is not hungry. Rebecca Adkins sometimes feels she is out of control and then feels guilty that she made poor food choices. She has been working on behavior modification techniques to help reduce her emotional eating and has been somewhat successful. She shows no sign of suicidal or homicidal  ideations.  ASSESSMENT AND PLAN:  Insulin resistance  Other depression - with emotional eating   At risk for diabetes mellitus  Class 1 obesity with serious comorbidity and body mass index (BMI) of 33.0 to 33.9 in adult, unspecified obesity type  PLAN:  Insulin Resistance Briannia will continue to work on weight loss, exercise, and decreasing simple carbohydrates in her diet to help decrease the risk of diabetes. She was informed that eating too many simple carbohydrates or too many calories at one sitting increases the likelihood of GI side effects. Meya will continue with the meal plan and follow up with [place] as directed to monitor her progress.  Diabetes risk counselling Rebecca Adkins was given extended (15 minutes) diabetes prevention counseling today. She is 28 y.o. female and has risk factors for diabetes including obesity and insulin resistance. We discussed intensive lifestyle modifications today with an emphasis on weight loss as well as increasing exercise and decreasing simple carbohydrates in her diet.  Depression with Emotional Eating Behaviors We discussed behavior modification techniques today to help Rebecca Adkins deal with her emotional eating and depression. She has agreed to start Bupropion (Wellbutrin SR) 100 mg once daily in the morning #30 with no refills and follow up as directed.  Obesity [name] is currently in the action stage of change. As such, her goal is to continue with weight loss efforts She has agreed to follow the Category 3 plan We discussed the following Behavioral Modification Strategies today: planning for success, better snacking choices and increasing lean protein intake  We discussed healthy snack options  today.  Rebecca Adkins has agreed to follow up with our clinic in 2 weeks. She was informed of the importance of frequent follow up visits to maximize her success with intensive lifestyle modifications for her multiple health conditions.  ALLERGIES: Allergies   Allergen Reactions  . Articaine Anaphylaxis  . Sodium Bisulfate Anaphylaxis  . Amoxicillin Hives     " Hives and rash "  . Clindamycin/Lincomycin Hives  . Nickel Hives    " hives and a rash"  . Penicillins     Has patient had a PCN reaction causing immediate rash, facial/tongue/throat swelling, SOB or lightheadedness with hypotension: Yes Has patient had a PCN reaction causing severe rash involving mucus membranes or skin necrosis: No Has patient had a PCN reaction that required hospitalization No Has patient had a PCN reaction occurring within the last 10 years: No If all of the above answers are "NO", then may proceed with Cephalosporin use.   . Tramadol     MEDICATIONS: Current Outpatient Medications on File Prior to Visit  Medication Sig Dispense Refill  . famotidine (PEPCID) 10 MG tablet Take 10 mg by mouth 2 (two) times daily.    . folic acid (FOLVITE) 1 MG tablet Take 1 mg by mouth daily.    . Vitamin D, Ergocalciferol, (DRISDOL) 1.25 MG (50000 UT) CAPS capsule Take 50,000 Units by mouth every 7 (seven) days.     No current facility-administered medications on file prior to visit.     PAST MEDICAL HISTORY: Past Medical History:  Diagnosis Date  . Back pain   . Cancer (HCC)    cervical  . Cervical dysplasia   . Clavicle fracture    left  . Elevated liver enzymes   . Family history of BRCA2 gene positive   . Family history of breast cancer   . Family history of ovarian cancer   . GERD (gastroesophageal reflux disease)   . Headache   . High cholesterol   . Joint pain   . Ovarian cyst   . PONV (postoperative nausea and vomiting)   . Seizures ([place])    as a child only  . Vitamin D deficiency     PAST SURGICAL HISTORY: Past Surgical History:  Procedure Laterality Date  . BUNIONECTOMY    . DILATION AND CURETTAGE OF UTERUS    . HARDWARE REMOVAL     left clavicle  . LEEP    . ORIF CLAVICULAR FRACTURE Left 08/21/2016   Procedure: OPEN REDUCTION INTERNAL  FIXATION (ORIF) LEFT CLAVICLE FRACTURE;  Surgeon: Leandrew Koyanagi, MD;  Location: [facility];  Service: Orthopedics;  Laterality: Left;  . OVARY SURGERY      SOCIAL HISTORY: Social History   Tobacco Use  . Smoking status: Current Every Day Smoker    Packs/day: 1.00    Years: 13.00    Pack years: 13.00    Types: Cigarettes  . Smokeless tobacco: Never Used  . Tobacco comment: PT STATES SHE QUIT SMOKING 1 WEEK AGO  Substance Use Topics  . Alcohol use: Yes    Alcohol/week: 13.0 standard drinks    Types: 3 Glasses of wine, 10 Cans of beer per week  . Drug use: No    FAMILY HISTORY: Family History  Problem Relation Age of Onset  . Hypertension Mother   . Ovarian cancer Mother 25  . Obesity Mother   . Diabetes Other   . Hypertension Other   . COPD Other   . Other Other   . Breast cancer  Sister 62  . BRCA 1/2 Sister        BRCA2 pos  . Hypertension Sister   . Colon cancer Paternal Aunt        dx over 61  . Hypertension Maternal Grandmother   . Diabetes Maternal Grandmother   . Kidney disease Maternal Grandmother   . Stroke Maternal Grandfather   . COPD Paternal Grandmother   . Other Paternal Uncle        infant death  . Breast cancer Other        PGMs sister  . Breast cancer Other        PGMs sister    ROS: Review of Systems  Constitutional: Positive for weight loss.  Genitourinary: Negative for frequency.  Endo/Heme/Allergies: Negative for polydipsia.       Negative for polyphagia positive for cravings  Psychiatric/Behavioral: Positive for depression. Negative for suicidal ideas.    PHYSICAL EXAM: Blood pressure 118/83, pulse 96, temperature 98.3 F (36.8 C), temperature source Oral, height 5' 8" (1.727 m), weight 220 lb (99.8 kg), last menstrual period 05/18/2019, SpO2 99 %. Body mass index is 33.45 kg/m. Physical Exam Vitals signs reviewed.  Constitutional:      Appearance: Normal appearance. She is well-developed. She is obese.  Cardiovascular:     Rate and  Rhythm: Normal rate.  Pulmonary:     Effort: Pulmonary effort is normal.  Musculoskeletal: Normal range of motion.  Skin:    General: Skin is warm and dry.  Neurological:     Mental Status: She is alert and oriented to person, place, and time.  Psychiatric:        Mood and Affect: Mood normal.        Behavior: Behavior normal.        Thought Content: Thought content does not include homicidal or suicidal ideation.     RECENT LABS AND TESTS: BMET No results found for: NA, K, CL, CO2, GLUCOSE, BUN, CREATININE, CALCIUM, GFRNONAA, GFRAA Lab Results  Component Value Date   HGBA1C 5.3 05/12/2019   Lab Results  Component Value Date   INSULIN 14.4 05/12/2019   CBC    Component Value Date/Time   WBC 7.8 08/21/2016 1036   RBC 4.05 08/21/2016 1036   HGB 12.9 08/21/2016 1036   HCT 38.1 08/21/2016 1036   PLT 274 08/21/2016 1036   MCV 94.1 08/21/2016 1036   MCH 31.9 08/21/2016 1036   MCHC 33.9 08/21/2016 1036   RDW 12.8 08/21/2016 1036   Iron/TIBC/Ferritin/ %Sat No results found for: IRON, TIBC, FERRITIN, IRONPCTSAT Lipid Panel  No results found for: CHOL, TRIG, HDL, CHOLHDL, VLDL, LDLCALC, LDLDIRECT Hepatic Function Panel  No results found for: PROT, ALBUMIN, AST, ALT, ALKPHOS, BILITOT, BILIDIR, IBILI    Component Value Date/Time   TSH 2.500 12/06/2018 1359      OBESITY BEHAVIORAL INTERVENTION VISIT  Today's visit was # 3   Starting weight: 225 lbs Starting date: 05/12/2019 Today's weight : 220 lbs Today's date: 06/13/2019 Total lbs lost to date: 5    06/13/2019  Height 5' 8" (1.727 m)  Weight 220 lb (99.8 kg)  BMI (Calculated) 33.46  BLOOD PRESSURE - SYSTOLIC 470  BLOOD PRESSURE - DIASTOLIC 83   Body Fat % 96.2 %  Total Body Water (lbs) 88 lbs    ASK: We discussed the diagnosis of obesity with Tempie Donning today and Symphany agreed to give [place] permission to discuss obesity behavioral modification therapy today.  ASSESS: Luise has the diagnosis of obesity  and her BMI today is 33.46 Lynann is in the action stage of change   ADVISE: Nakina was educated on the multiple health risks of obesity as well as the benefit of weight loss to improve her health. She was advised of the need for long term treatment and the importance of lifestyle modifications to improve her current health and to decrease her risk of future health problems.  AGREE: Multiple dietary modification options and treatment options were discussed and  Nadalyn agreed to follow the recommendations documented in the above note.  ARRANGE: Nataliya was educated on the importance of frequent visits to treat obesity as outlined per CMS and USPSTF guidelines and agreed to schedule her next follow up appointment today.  I, Doreene Nest, am acting as transcriptionist for [employer], FNP-C  I have reviewed the above documentation for accuracy and completeness, and I agree with the above.  - Dawn Whitmire, FNP-C.

## 2019-06-16 ENCOUNTER — Encounter (INDEPENDENT_AMBULATORY_CARE_PROVIDER_SITE_OTHER): Payer: Self-pay | Admitting: Family Medicine

## 2019-06-16 DIAGNOSIS — F32A Depression, unspecified: Secondary | ICD-10-CM | POA: Insufficient documentation

## 2019-06-16 DIAGNOSIS — E8881 Metabolic syndrome: Secondary | ICD-10-CM | POA: Insufficient documentation

## 2019-06-16 DIAGNOSIS — F329 Major depressive disorder, single episode, unspecified: Secondary | ICD-10-CM | POA: Insufficient documentation

## 2019-06-16 DIAGNOSIS — Z6833 Body mass index (BMI) 33.0-33.9, adult: Secondary | ICD-10-CM | POA: Insufficient documentation

## 2019-06-16 DIAGNOSIS — E669 Obesity, unspecified: Secondary | ICD-10-CM | POA: Insufficient documentation

## 2019-06-16 DIAGNOSIS — E88819 Insulin resistance, unspecified: Secondary | ICD-10-CM | POA: Insufficient documentation

## 2019-06-30 ENCOUNTER — Ambulatory Visit (INDEPENDENT_AMBULATORY_CARE_PROVIDER_SITE_OTHER): Payer: BC Managed Care – PPO | Admitting: Family Medicine

## 2019-06-30 DIAGNOSIS — M25562 Pain in left knee: Secondary | ICD-10-CM | POA: Diagnosis not present

## 2019-07-07 DIAGNOSIS — M25562 Pain in left knee: Secondary | ICD-10-CM | POA: Diagnosis not present

## 2019-07-12 DIAGNOSIS — M25562 Pain in left knee: Secondary | ICD-10-CM | POA: Diagnosis not present

## 2019-09-20 ENCOUNTER — Other Ambulatory Visit: Payer: Self-pay

## 2019-09-26 ENCOUNTER — Other Ambulatory Visit: Payer: Self-pay

## 2020-01-26 DIAGNOSIS — Z8616 Personal history of COVID-19: Secondary | ICD-10-CM

## 2020-01-26 HISTORY — DX: Personal history of COVID-19: Z86.16

## 2020-07-17 ENCOUNTER — Other Ambulatory Visit: Payer: Self-pay | Admitting: Orthopedic Surgery

## 2020-07-19 ENCOUNTER — Other Ambulatory Visit: Payer: Self-pay

## 2020-07-19 ENCOUNTER — Encounter: Payer: Self-pay | Admitting: *Deleted

## 2020-07-19 ENCOUNTER — Encounter
Admission: RE | Admit: 2020-07-19 | Discharge: 2020-07-19 | Disposition: A | Discharge status: Home / Self Care | Payer: 59 | Source: Ambulatory Visit | Attending: Orthopedic Surgery | Admitting: Orthopedic Surgery

## 2020-07-19 HISTORY — DX: Obesity, class 1: E66.811

## 2020-07-19 HISTORY — DX: Pneumonia, unspecified organism: J18.9

## 2020-07-19 HISTORY — DX: Obesity, unspecified: E66.9

## 2020-07-19 NOTE — Patient Instructions (Addendum)
Your procedure is scheduled on:  Thursday, November 4 Report to Day Surgery on the 2nd floor of the [facility]. To find out your arrival time, please call [phone] between 1PM - 3PM on: Wednesday, November 3  REMEMBER: Instructions that are not followed completely may result in serious medical risk, up to and including death; or upon the discretion of your surgeon and anesthesiologist your surgery may need to be rescheduled.  Do not eat food after midnight the night before surgery.  No gum chewing, lozengers or hard candies.  You may however, drink CLEAR liquids up to 2 hours before you are scheduled to arrive for your surgery. Do not drink anything within 2 hours of your scheduled arrival time.  Clear liquids include: - water  - apple juice without pulp - gatorade (not RED, PURPLE, OR BLUE) - black coffee or tea (Do NOT add milk or creamers to the coffee or tea) Do NOT drink anything that is not on this list.  TAKE THESE MEDICATIONS THE MORNING OF SURGERY WITH A SIP OF WATER:  1.   Famotidine (Pepcid) - (take one the night before and one on the morning of surgery - helps to prevent nausea after surgery.)  Stop Metformin 2 days prior to surgery. Last day to take is Monday, November 1; resume After surgery.  One week prior to surgery: Stop Anti-inflammatories (NSAIDS) such as Advil, Aleve, Ibuprofen, Motrin, Naproxen, Naprosyn and Aspirin based products such as Excedrin, Goodys Powder, BC Powder. Stop ANY OVER THE COUNTER supplements until after surgery. (You may continue taking Tylenol.)  No Alcohol for 24 hours before or after surgery.  No Smoking including e-cigarettes for 24 hours prior to surgery.  No chewable tobacco products for at least 6 hours prior to surgery.  No nicotine patches on the day of surgery.  Do not use any "recreational" drugs for at least a week prior to your surgery.  Please be advised that the combination of cocaine and anesthesia may have  negative outcomes, up to and including death. If you test positive for cocaine, your surgery will be cancelled.  On the morning of surgery brush your teeth with toothpaste and water, you may rinse your mouth with mouthwash if you wish. Do not swallow any toothpaste or mouthwash.  Do not wear jewelry, make-up, hairpins, clips or nail polish.  Do not wear lotions, powders, or perfumes.   Do not shave 48 hours prior to surgery.   Do not bring valuables to the hospital. [facility] is not responsible for any missing/lost belongings or valuables.   Use CHG Soap as directed on instruction sheet..  Notify your doctor if there is any change in your medical condition (cold, fever, infection).  Wear comfortable clothing (specific to your surgery type) to the hospital.  Plan for stool softeners for home use; pain medications have a tendency to cause constipation. You can also help prevent constipation by eating foods high in fiber such as fruits and vegetables and drinking plenty of fluids as your diet allows.  After surgery, you can help prevent lung complications by doing breathing exercises.  Take deep breaths and cough every 1-2 hours. Your doctor may order a device called an Incentive Spirometer to help you take deep breaths.  If you are being discharged the day of surgery, you will not be allowed to drive home. You will need a responsible adult (18 years or older) to drive you home and stay with you that night.   If you  are taking public transportation, you will need to have a responsible adult (18 years or older) with you. Please confirm with your physician that it is acceptable to use public transportation.   Please call the [facility] at [phone] if you have any questions about these instructions.  Visitation Policy:  Patients undergoing a surgery or procedure may have one family member or support person with them as long as that person is not COVID-19  positive or experiencing its symptoms.  That person may remain in the waiting area during the procedure.

## 2020-07-23 ENCOUNTER — Encounter: Payer: Self-pay | Admitting: Orthopedic Surgery

## 2020-07-24 ENCOUNTER — Other Ambulatory Visit: Payer: Self-pay

## 2020-07-24 ENCOUNTER — Encounter: Payer: Self-pay | Admitting: Urgent Care

## 2020-07-24 ENCOUNTER — Other Ambulatory Visit
Admission: RE | Admit: 2020-07-24 | Discharge: 2020-07-24 | Disposition: A | Discharge status: Home / Self Care | Payer: 59 | Source: Ambulatory Visit | Attending: Orthopedic Surgery | Admitting: Orthopedic Surgery

## 2020-07-24 DIAGNOSIS — Z01812 Encounter for preprocedural laboratory examination: Secondary | ICD-10-CM | POA: Insufficient documentation

## 2020-07-24 DIAGNOSIS — Z20822 Contact with and (suspected) exposure to covid-19: Secondary | ICD-10-CM | POA: Diagnosis not present

## 2020-07-24 LAB — BASIC METABOLIC PANEL
Anion gap: 10 (ref 5–15)
BUN: 9 mg/dL (ref 6–20)
CO2: 24 mmol/L (ref 22–32)
Calcium: 9.3 mg/dL (ref 8.9–10.3)
Chloride: 103 mmol/L (ref 98–111)
Creatinine, Ser: 0.76 mg/dL (ref 0.44–1.00)
GFR, Estimated: >60 mL/min (ref >60)
Glucose, Bld: 98 mg/dL (ref 70–99)
Potassium: 4.2 mmol/L (ref 3.5–5.1)
Sodium: 137 mmol/L (ref 135–145)

## 2020-07-24 LAB — CBC WITH DIFFERENTIAL/PLATELET
Abs Immature Granulocytes: 0.02 10*3/uL (ref 0.00–0.07)
Basophils Absolute: 0 10*3/uL (ref 0.0–0.1)
Basophils Relative: 0 %
Eosinophils Absolute: 0.2 10*3/uL (ref 0.0–0.5)
Eosinophils Relative: 2 %
HCT: 39.8 % (ref 36.0–46.0)
Hemoglobin: 13.7 g/dL (ref 12.0–15.0)
Immature Granulocytes: 0 %
Lymphocytes Relative: 23 %
Lymphs Abs: 1.7 10*3/uL (ref 0.7–4.0)
MCH: 31.6 pg (ref 26.0–34.0)
MCHC: 34.4 g/dL (ref 30.0–36.0)
MCV: 91.7 fL (ref 80.0–100.0)
Monocytes Absolute: 0.6 10*3/uL (ref 0.1–1.0)
Monocytes Relative: 8 %
Neutro Abs: 5.1 10*3/uL (ref 1.7–7.7)
Neutrophils Relative %: 67 %
Platelets: 325 10*3/uL (ref 150–400)
RBC: 4.34 MIL/uL (ref 3.87–5.11)
RDW: 12.7 % (ref 11.5–15.5)
WBC: 7.7 10*3/uL (ref 4.0–10.5)
nRBC: 0 % (ref 0.0–0.2)

## 2020-07-24 LAB — PROTIME-INR
INR: 0.9 (ref 0.8–1.2)
Prothrombin Time: 11.7 seconds (ref 11.4–15.2)

## 2020-07-24 LAB — APTT: aPTT: 29 seconds (ref 24–36)

## 2020-07-24 LAB — SARS CORONAVIRUS 2 (TAT 6-24 HRS): SARS Coronavirus 2: NEGATIVE

## 2020-07-24 NOTE — Progress Notes (Signed)
[facility] Perioperative Services: Pre-Admission/Anesthesia Testing   Date: 07/24/20 Name: Rebecca Adkins MRN:   [id number]  Re: Consideration of preoperative prophylactic antibiotic change   Request sent to: Thornton Park, MD (routed and/or faxed via [facility])  Planned Surgical Procedure(s):    Case: [id number] Date/Time: 07/26/20 0730   Procedure: LEFT KNEE ARTHROSCOPY WITH MEDIAL MENISECTOMY (Left Knee)   Anesthesia type: General   Pre-op diagnosis: Medial Meniscus Tear of Left Knee   Location: [place] [facility]   Surgeons: Thornton Park, MD    Notes: 1. Patient has a documented allergy to PCN  2. Advising that PCN has caused her to experience low severity urticarial rash as a child. 3. Screened as appropriate for cephalosporin use during medication reconciliation . No immediate angioedema, dysphagia, SOB, anaphylaxis symptoms. . No severe rash involving mucous membranes or skin necrosis. . No hospital admissions related to side effects of PCN/cephalosporin use.  . No documented reaction to PCN or cephalosporin in the last 10 years.  Request:  As an evidence based approach to reducing the rate of incidence for post-operative SSI and the development of MDROs, could an agent with narrower coverage for preoperative prophylaxis in this patient's upcoming surgical course be considered?  1. Currently ordered preoperative prophylactic ABX: vancomycin.   2. Specifically requesting change to cephalosporin (CEFAZOLIN).   3. Please communicate decision with me and I will change the orders in Epic as per your direction.   Things to consider:  Many patients report that they were "allergic" to PCN earlier in life, however this does not translate into a true lifelong allergy. Patients can lose sensitivity to specific IgE antibodies over time if PCN is avoided (Kleris & Lugar, 2019).   Up to 10% of the adult population and 15% of  hospitalized patients report an allergy to PCN, however clinical studies suggest that 90% of those reporting an allergy can tolerate PCN antibiotics (Kleris & Lugar, 2019).   Cross-sensitivity between PCN and cephalosporins has been documented as being as high as 10%, however this estimation included data believed to have been collected in a setting where there was contamination. Newer data suggests that the prevalence of cross-sensitivity between PCN and cephalosporins is actually estimated to be closer to 1% (Hermanides et al., 2018).    Patients labeled as PCN allergic, whether they are truly allergic or not, have been found to have inferior outcomes in terms of rates of serious infection, and these patients tend to have longer hospital stays (Chrisney, 2019).   Treatment related secondary infections, such as Clostridioides difficile, have been linked to the improper use of broad spectrum antibiotics in patients improperly labeled as PCN allergic (Kleris & Lugar, 2019).   Anaphylaxis from cephalosporins is rare and the evidence suggests that there is no increased risk of an anaphylactic type reaction when cephalosporins are used in a PCN allergic patient (Pichichero, 2006).  Citations: Hermanides J, Lemkes BA, Prins Pearla Dubonnet MW, Terreehorst I. Presumed ?-Lactam Allergy and Cross-reactivity in the Operating Theater: A Practical Approach. Anesthesiology. 2018 Aug;129(2):335-342. doi: 10.1097/ALN.0000000000002252. PMID: 91478295.  Kleris, Rothschild., & Lugar, P. L. (2019). Things We Do For No Reason: Failing to Question a Penicillin Allergy History. Journal of hospital medicine, 14(10), [phone]. Advance online publication. https://www.wallace-middleton.info/  Pichichero, M. E. (2006). Cephalosporins can be prescribed safely for penicillin-allergic patients. Journal of family medicine, 55(2), 106-112. Accessed:  https://cdn.mdedge.com/files/s43fs-public/Document/September-2017/5502JFP_AppliedEvidence1.pdf   Honor Loh, MSN, APRN, FNP-C, [place]  [facility] Services Nurse Practitioner 07/24/20 12:32 PM

## 2020-07-26 ENCOUNTER — Encounter
Admission: RE | Disposition: A | Discharge status: Home / Self Care | Payer: Self-pay | Source: Home / Self Care | Attending: Orthopedic Surgery

## 2020-07-26 ENCOUNTER — Ambulatory Visit: Payer: 59 | Admitting: Urgent Care

## 2020-07-26 ENCOUNTER — Other Ambulatory Visit: Payer: Self-pay

## 2020-07-26 ENCOUNTER — Ambulatory Visit
Admission: RE | Admit: 2020-07-26 | Discharge: 2020-07-26 | Disposition: A | Discharge status: Home / Self Care | Payer: 59 | Attending: Orthopedic Surgery | Admitting: Orthopedic Surgery

## 2020-07-26 ENCOUNTER — Encounter: Payer: Self-pay | Admitting: Orthopedic Surgery

## 2020-07-26 DIAGNOSIS — F17210 Nicotine dependence, cigarettes, uncomplicated: Secondary | ICD-10-CM | POA: Insufficient documentation

## 2020-07-26 DIAGNOSIS — Z884 Allergy status to anesthetic agent status: Secondary | ICD-10-CM | POA: Diagnosis not present

## 2020-07-26 DIAGNOSIS — M794 Hypertrophy of (infrapatellar) fat pad: Secondary | ICD-10-CM | POA: Diagnosis not present

## 2020-07-26 DIAGNOSIS — Z885 Allergy status to narcotic agent status: Secondary | ICD-10-CM | POA: Insufficient documentation

## 2020-07-26 DIAGNOSIS — M659 Synovitis and tenosynovitis, unspecified: Secondary | ICD-10-CM | POA: Diagnosis not present

## 2020-07-26 DIAGNOSIS — Z886 Allergy status to analgesic agent status: Secondary | ICD-10-CM | POA: Diagnosis not present

## 2020-07-26 DIAGNOSIS — X58XXXA Exposure to other specified factors, initial encounter: Secondary | ICD-10-CM | POA: Diagnosis not present

## 2020-07-26 DIAGNOSIS — Z881 Allergy status to other antibiotic agents status: Secondary | ICD-10-CM | POA: Diagnosis not present

## 2020-07-26 DIAGNOSIS — Z880 Allergy status to penicillin: Secondary | ICD-10-CM | POA: Diagnosis not present

## 2020-07-26 DIAGNOSIS — S83242A Other tear of medial meniscus, current injury, left knee, initial encounter: Secondary | ICD-10-CM | POA: Diagnosis not present

## 2020-07-26 HISTORY — PX: JOINT REPLACEMENT: SHX530

## 2020-07-26 HISTORY — PX: KNEE ARTHROSCOPY WITH MEDIAL MENISECTOMY: SHX5651

## 2020-07-26 LAB — URINE DRUG SCREEN, QUALITATIVE (ARMC ONLY)
Amphetamines, Ur Screen: NOT DETECTED
Barbiturates, Ur Screen: NOT DETECTED
Benzodiazepine, Ur Scrn: NOT DETECTED
Cannabinoid 50 Ng, Ur ~~LOC~~: NOT DETECTED
Cocaine Metabolite,Ur ~~LOC~~: NOT DETECTED
MDMA (Ecstasy)Ur Screen: NOT DETECTED
Methadone Scn, Ur: NOT DETECTED
Opiate, Ur Screen: POSITIVE — AB
Phencyclidine (PCP) Ur S: NOT DETECTED
Tricyclic, Ur Screen: NOT DETECTED

## 2020-07-26 LAB — POCT PREGNANCY, URINE: Preg Test, Ur: NEGATIVE

## 2020-07-26 SURGERY — ARTHROSCOPY, KNEE, WITH MEDIAL MENISCECTOMY
Anesthesia: General | Site: Knee | Laterality: Left

## 2020-07-26 MED ORDER — BUPIVACAINE-EPINEPHRINE (PF) 0.25% -1:200000 IJ SOLN
INTRAMUSCULAR | Status: AC
  Filled 2020-07-26: qty 30

## 2020-07-26 MED ORDER — FENTANYL CITRATE (PF) 100 MCG/2ML IJ SOLN
INTRAMUSCULAR | Status: AC
  Filled 2020-07-26: qty 2

## 2020-07-26 MED ORDER — ORAL CARE MOUTH RINSE: 15.0000 mL | Freq: Once | OROMUCOSAL | Status: AC

## 2020-07-26 MED ORDER — OXYCODONE HCL 5 MG PO TABS
5.0000 mg | ORAL_TABLET | ORAL | 0 refills | Status: DC | PRN
Start: 2020-07-26 — End: 2020-09-11

## 2020-07-26 MED ORDER — ACETAMINOPHEN 10 MG/ML IV SOLN
INTRAVENOUS | Status: AC
  Filled 2020-07-26: qty 100

## 2020-07-26 MED ORDER — CHLORHEXIDINE GLUCONATE CLOTH 2 % EX PADS
6.0000 | MEDICATED_PAD | Freq: Once | CUTANEOUS | Status: AC
  Administered 2020-07-26: 6 via TOPICAL

## 2020-07-26 MED ORDER — ACETAMINOPHEN 10 MG/ML IV SOLN
INTRAVENOUS | Status: DC | PRN
  Administered 2020-07-26: 1000 mg via INTRAVENOUS

## 2020-07-26 MED ORDER — VANCOMYCIN HCL IN DEXTROSE 1-5 GM/200ML-% IV SOLN: 1000.0000 mg | INTRAVENOUS | Status: AC

## 2020-07-26 MED ORDER — SCOPOLAMINE 1 MG/3DAYS TD PT72: 1.0000 | MEDICATED_PATCH | Freq: Once | TRANSDERMAL | Status: DC

## 2020-07-26 MED ORDER — PHENYLEPHRINE HCL (PRESSORS) 10 MG/ML IV SOLN
INTRAVENOUS | Status: DC | PRN
  Administered 2020-07-26: 100 ug via INTRAVENOUS

## 2020-07-26 MED ORDER — LACTATED RINGERS IV SOLN: INTRAVENOUS | Status: DC

## 2020-07-26 MED ORDER — MIDAZOLAM HCL 2 MG/2ML IJ SOLN
INTRAMUSCULAR | Status: AC
  Filled 2020-07-26: qty 2

## 2020-07-26 MED ORDER — ONDANSETRON HCL 4 MG PO TABS: 4.0000 mg | ORAL_TABLET | Freq: Three times a day (TID) | ORAL | 0 refills | Status: DC | PRN

## 2020-07-26 MED ORDER — PROMETHAZINE HCL 25 MG/ML IJ SOLN: 6.2500 mg | INTRAMUSCULAR | Status: DC | PRN

## 2020-07-26 MED ORDER — PROPOFOL 10 MG/ML IV BOLUS
INTRAVENOUS | Status: DC | PRN
  Administered 2020-07-26: 200 mg via INTRAVENOUS

## 2020-07-26 MED ORDER — MIDAZOLAM HCL 2 MG/2ML IJ SOLN
INTRAMUSCULAR | Status: DC | PRN
  Administered 2020-07-26: 2 mg via INTRAVENOUS

## 2020-07-26 MED ORDER — OXYCODONE HCL 5 MG PO TABS: 5.0000 mg | ORAL_TABLET | Freq: Once | ORAL | Status: AC

## 2020-07-26 MED ORDER — VANCOMYCIN HCL IN DEXTROSE 1-5 GM/200ML-% IV SOLN
INTRAVENOUS | Status: AC
  Administered 2020-07-26: 1000 mg via INTRAVENOUS
  Filled 2020-07-26: qty 200

## 2020-07-26 MED ORDER — LIDOCAINE HCL (PF) 1 % IJ SOLN
INTRAMUSCULAR | Status: AC
  Filled 2020-07-26: qty 30

## 2020-07-26 MED ORDER — LIDOCAINE HCL (PF) 2 % IJ SOLN
INTRAMUSCULAR | Status: AC
  Filled 2020-07-26: qty 5

## 2020-07-26 MED ORDER — BUPIVACAINE-EPINEPHRINE (PF) 0.25% -1:200000 IJ SOLN
INTRAMUSCULAR | Status: DC | PRN
  Administered 2020-07-26: 20 mL

## 2020-07-26 MED ORDER — DEXMEDETOMIDINE HCL IN NACL 200 MCG/50ML IV SOLN
INTRAVENOUS | Status: DC | PRN
  Administered 2020-07-26: 8 ug via INTRAVENOUS

## 2020-07-26 MED ORDER — LIDOCAINE HCL (PF) 1 % IJ SOLN
INTRAMUSCULAR | Status: DC | PRN
  Administered 2020-07-26: 4 mL

## 2020-07-26 MED ORDER — ASPIRIN EC 81 MG PO TBEC: 81.0000 mg | DELAYED_RELEASE_TABLET | Freq: Every day | ORAL | 0 refills | Status: DC

## 2020-07-26 MED ORDER — DEXAMETHASONE SODIUM PHOSPHATE 10 MG/ML IJ SOLN
INTRAMUSCULAR | Status: DC | PRN
  Administered 2020-07-26: 10 mg via INTRAVENOUS

## 2020-07-26 MED ORDER — OXYCODONE HCL 5 MG PO TABS
ORAL_TABLET | ORAL | Status: AC
  Administered 2020-07-26: 5 mg via ORAL
  Filled 2020-07-26: qty 1

## 2020-07-26 MED ORDER — CHLORHEXIDINE GLUCONATE 0.12 % MT SOLN: 15.0000 mL | Freq: Once | OROMUCOSAL | Status: AC

## 2020-07-26 MED ORDER — FENTANYL CITRATE (PF) 100 MCG/2ML IJ SOLN
INTRAMUSCULAR | Status: DC | PRN
  Administered 2020-07-26 (×5): 25 ug via INTRAVENOUS

## 2020-07-26 MED ORDER — PROPOFOL 500 MG/50ML IV EMUL
INTRAVENOUS | Status: AC
  Filled 2020-07-26: qty 50

## 2020-07-26 MED ORDER — PROPOFOL 10 MG/ML IV BOLUS
INTRAVENOUS | Status: AC
  Filled 2020-07-26: qty 20

## 2020-07-26 MED ORDER — CHLORHEXIDINE GLUCONATE 0.12 % MT SOLN
OROMUCOSAL | Status: AC
  Administered 2020-07-26: 15 mL via OROMUCOSAL
  Filled 2020-07-26: qty 15

## 2020-07-26 MED ORDER — LIDOCAINE HCL (CARDIAC) PF 100 MG/5ML IV SOSY
PREFILLED_SYRINGE | INTRAVENOUS | Status: DC | PRN
  Administered 2020-07-26: 60 mg via INTRAVENOUS

## 2020-07-26 MED ORDER — FENTANYL CITRATE (PF) 100 MCG/2ML IJ SOLN
25.0000 ug | INTRAMUSCULAR | Status: DC | PRN
  Administered 2020-07-26: 25 ug via INTRAVENOUS

## 2020-07-26 MED ORDER — SCOPOLAMINE 1 MG/3DAYS TD PT72
MEDICATED_PATCH | TRANSDERMAL | Status: AC
  Administered 2020-07-26: 1.5 mg via TRANSDERMAL
  Filled 2020-07-26: qty 1

## 2020-07-26 MED ORDER — PROPOFOL 500 MG/50ML IV EMUL
INTRAVENOUS | Status: DC | PRN
  Administered 2020-07-26: 150 ug/kg/min via INTRAVENOUS

## 2020-07-26 MED ORDER — ONDANSETRON HCL 4 MG/2ML IJ SOLN
INTRAMUSCULAR | Status: DC | PRN
  Administered 2020-07-26: 4 mg via INTRAVENOUS

## 2020-07-26 SURGICAL SUPPLY — 38 items
BUR RADIUS 3.5 (BURR) ×3 IMPLANT
BUR RADIUS 4.0X18.5 (BURR) ×3 IMPLANT
CLOSURE WOUND 1/2 X4 (GAUZE/BANDAGES/DRESSINGS) ×1
COOLER POLAR GLACIER W/PUMP (MISCELLANEOUS) ×3 IMPLANT
COVER WAND RF STERILE (DRAPES) ×3 IMPLANT
CUFF TOURN 24 STER (MISCELLANEOUS) IMPLANT
CUFF TOURN 30 STER DUAL PORT (MISCELLANEOUS) ×3 IMPLANT
DRAPE IMP U-DRAPE 54X76 (DRAPES) ×3 IMPLANT
DURAPREP 26ML APPLICATOR (WOUND CARE) ×9 IMPLANT
GAUZE SPONGE 4X4 12PLY STRL (GAUZE/BANDAGES/DRESSINGS) ×3 IMPLANT
GAUZE XEROFORM 1X8 LF (GAUZE/BANDAGES/DRESSINGS) ×3 IMPLANT
GLOVE BIOGEL PI IND STRL 9 (GLOVE) ×1 IMPLANT
GLOVE BIOGEL PI INDICATOR 9 (GLOVE) ×2
GLOVE SURG 9.0 ORTHO LTXF (GLOVE) ×6 IMPLANT
GOWN STRL REUS W/ TWL LRG LVL3 (GOWN DISPOSABLE) ×1 IMPLANT
GOWN STRL REUS W/TWL 2XL LVL3 (GOWN DISPOSABLE) ×3 IMPLANT
GOWN STRL REUS W/TWL LRG LVL3 (GOWN DISPOSABLE) ×3
IV LACTATED RINGER IRRG 3000ML (IV SOLUTION) ×18
IV LR IRRIG 3000ML ARTHROMATIC (IV SOLUTION) ×6 IMPLANT
KIT TURNOVER KIT A (KITS) ×3 IMPLANT
MANIFOLD NEPTUNE II (INSTRUMENTS) ×3 IMPLANT
MAT ABSORB  FLUID 56X50 GRAY (MISCELLANEOUS) ×2
MAT ABSORB FLUID 56X50 GRAY (MISCELLANEOUS) ×1 IMPLANT
NEEDLE HYPO 22GX1.5 SAFETY (NEEDLE) ×3 IMPLANT
PACK ARTHROSCOPY KNEE (MISCELLANEOUS) ×3 IMPLANT
PAD ABD DERMACEA PRESS 5X9 (GAUZE/BANDAGES/DRESSINGS) ×6 IMPLANT
PAD WRAPON POLAR KNEE (MISCELLANEOUS) ×1 IMPLANT
SET TUBE SUCT SHAVER OUTFL 24K (TUBING) ×3 IMPLANT
SET TUBE TIP INTRA-ARTICULAR (MISCELLANEOUS) ×3 IMPLANT
SOL PREP PVP 2OZ (MISCELLANEOUS)
SOLUTION PREP PVP 2OZ (MISCELLANEOUS) IMPLANT
STRIP CLOSURE SKIN 1/2X4 (GAUZE/BANDAGES/DRESSINGS) ×2 IMPLANT
SUT ETHILON 4-0 (SUTURE) ×3
SUT ETHILON 4-0 FS2 18XMFL BLK (SUTURE) ×1
SUTURE ETHLN 4-0 FS2 18XMF BLK (SUTURE) ×1 IMPLANT
TUBING ARTHRO INFLOW-ONLY STRL (TUBING) ×3 IMPLANT
WAND HAND CNTRL MULTIVAC 90 (MISCELLANEOUS) ×3 IMPLANT
WRAPON POLAR PAD KNEE (MISCELLANEOUS) ×3

## 2020-07-26 NOTE — H&P (Signed)
PREOPERATIVE H&P  Chief Complaint: Medial Meniscus Tear of Left Knee  HPI: Rebecca Adkins is a 29 y.o. female who presents for preoperative history and physical with a diagnosis of Medial Meniscus Tear of Left Knee. Symptoms of medial sided knee pain and mechanical symptoms are significantly impairing activities of daily living.  Patient is failed nonoperative management  She wished to proceed with arthroscopic partial medial meniscectomy.   Past Medical History:  Diagnosis Date  . Back pain   . Cancer (HCC)    cervical  . Cervical dysplasia   . Clavicle fracture    left  . Elevated liver enzymes   . Family history of BRCA2 gene positive   . Family history of breast cancer   . Family history of ovarian cancer   . GERD (gastroesophageal reflux disease)   . Headache   . High cholesterol   . History of 2019 novel coronavirus disease (COVID-19) 01/26/2020  . Joint pain   . Obesity (BMI 30.0-34.9)   . Ovarian cyst   . Pneumonia   . PONV (postoperative nausea and vomiting)   . Seizures ([place])    as a child only  . Vitamin D deficiency    Past Surgical History:  Procedure Laterality Date  . BUNIONECTOMY    . DILATION AND CURETTAGE OF UTERUS    . HARDWARE REMOVAL     left clavicle  . LEEP    . ORIF CLAVICULAR FRACTURE Left 08/21/2016   Procedure: OPEN REDUCTION INTERNAL FIXATION (ORIF) LEFT CLAVICLE FRACTURE;  Surgeon: Leandrew Koyanagi, MD;  Location: [facility];  Service: Orthopedics;  Laterality: Left;  . OVARY SURGERY     cyst removed   Social History   Socioeconomic History  . Marital status: Single    Spouse name: brian  . Number of children: Not on file  . Years of education: Not on file  . Highest education level: Not on file  Occupational History  . Occupation: work from home - [employer]  Tobacco Use  . Smoking status: Current Some Day Smoker    Packs/day: 0.25    Years: 13.00    Pack years: 3.25    Types: Cigarettes  . Smokeless tobacco: Never Used   Vaping Use  . Vaping Use: Every day  Substance and Sexual Activity  . Alcohol use: Yes    Comment: occassional  . Drug use: Yes    Types: Marijuana    Comment: edible marijuana used on occassion  . Sexual activity: Yes    Birth control/protection: Pill  Other Topics Concern  . Not on file  Social History Narrative  . Not on file   Social Determinants of Health   Financial Resource Strain:   . Difficulty of Paying Living Expenses: Not on file  Food Insecurity:   . Worried About Charity fundraiser in the Last Year: Not on file  . Ran Out of Food in the Last Year: Not on file  Transportation Needs:   . Lack of Transportation (Medical): Not on file  . Lack of Transportation (Non-Medical): Not on file  Physical Activity:   . Days of Exercise per Week: Not on file  . Minutes of Exercise per Session: Not on file  Stress:   . Feeling of Stress : Not on file  Social Connections:   . Frequency of Communication with Friends and Family: Not on file  . Frequency of Social Gatherings with Friends and Family: Not on file  . Attends Religious Services:  Not on file  . Active Member of Clubs or Organizations: Not on file  . Attends Archivist Meetings: Not on file  . Marital Status: Not on file   Family History  Problem Relation Age of Onset  . Hypertension Mother   . Ovarian cancer Mother 44  . Obesity Mother   . Diabetes Other   . Hypertension Other   . COPD Other   . Other Other   . Breast cancer Sister 16  . BRCA 1/2 Sister        BRCA2 pos  . Hypertension Sister   . Colon cancer Paternal Aunt        dx over 8  . Hypertension Maternal Grandmother   . Diabetes Maternal Grandmother   . Kidney disease Maternal Grandmother   . Stroke Maternal Grandfather   . COPD Paternal Grandmother   . Other Paternal Uncle        infant death  . Breast cancer Other        PGMs sister  . Breast cancer Other        PGMs sister   Allergies  Allergen Reactions  . Articaine  Anaphylaxis  . Clindamycin/Lincomycin Other (See Comments)    facial/tongue/throat/eye swelling, hives/rash, "very low blood pressure".  . Ibuprofen Other (See Comments)    Face/tongue swelling, rash, N/V  . Ketorolac Tromethamine Nausea And Vomiting and Rash  . Sodium Bisulfate Anaphylaxis  . Amoxicillin Hives     " Hives and rash "  . Nickel Hives    " hives and a rash"  . Vancomycin Itching  . Penicillins Hives    Has patient had a PCN reaction causing immediate rash, facial/tongue/throat swelling, SOB or lightheadedness with hypotension: Yes Has patient had a PCN reaction causing severe rash involving mucus membranes or skin necrosis: No Has patient had a PCN reaction that required hospitalization No Has patient had a PCN reaction occurring within the last 10 years: No If all of the above answers are "NO", then may proceed with Cephalosporin use.   . Tramadol Nausea And Vomiting and Rash   Prior to Admission medications   Medication Sig Start Date End Date Taking? Authorizing Provider  acetaminophen (TYLENOL) 500 MG tablet Take 1,000 mg by mouth every 6 (six) hours as needed for mild pain or moderate pain.   Yes [provider]  famotidine (PEPCID) 10 MG tablet Take 10 mg by mouth daily as needed for heartburn.    Yes [provider]  loratadine (CLARITIN) 10 MG tablet Take 10 mg by mouth daily.   Yes [provider]  metFORMIN (GLUCOPHAGE) 500 MG tablet Take 500 mg by mouth in the morning and at bedtime. 07/02/20  Yes [provider]  OZEMPIC, 0.25 OR 0.5 MG/DOSE, 2 MG/1.5ML SOPN Inject 0.5 mg into the skin every Monday. 06/21/20  Yes [provider]  Probiotic Product ([place]) CAPS Take 1 capsule by mouth daily.   Yes [provider]     Positive ROS: All other systems have been reviewed and were otherwise negative with the exception of those mentioned in the HPI and as above.  Physical Exam: General: Alert,  no acute distress Cardiovascular: Regular rate and rhythm, no murmurs rubs or gallops.  No pedal edema Respiratory: Clear to auscultation bilaterally, no wheezes rales or rhonchi. No cyanosis, no use of accessory musculature GI: No organomegaly, abdomen is soft and non-tender nondistended with positive bowel sounds. Skin: Skin intact, no lesions within the  operative field. Neurologic: Sensation intact distally Psychiatric: Patient is competent for consent with normal mood and affect Lymphatic: No cervical lymphadenopathy  MUSCULOSKELETAL: Right knee: Patient has tenderness of the medial joint line with a positive McMurray's test.  She has no ligamentous laxity.  She is neurovascular intact.  Range of motion is from full extension to varus 110 degrees of flexion.  Assessment: Medial Meniscus Tear of Left Knee  Plan: Plan for Procedure(s): LEFT KNEE ARTHROSCOPY WITH MEDIAL MENISECTOMY  I reviewed the details of the operation as well as the postoperative course with the patient in the preoperative area this morning.  A preop history and physical was performed at the bedside.  I discussed the risks and benefits of surgery. The risks include but are not limited to infection, bleeding , nerve or blood vessel injury, joint stiffness or loss of motion, persistent pain, weakness or instability, retear of the medial meniscus and the need for further surgery. Medical risks include but are not limited to DVT and pulmonary embolism, myocardial infarction, stroke, pneumonia, respiratory failure and death. Patient understood these risks and wished to proceed.     Thornton Park, MD   07/26/2020 7:53 AM

## 2020-07-26 NOTE — Transfer of Care (Signed)
Immediate Anesthesia Transfer of Care Note  Patient: Rebecca Adkins  Procedure(s) Performed: LEFT KNEE ARTHROSCOPY WITH  PARTIAL MEDIAL MENISECTOMY (Left Knee)  Patient Location: PACU  Anesthesia Type:General  Level of Consciousness: awake, alert  and oriented  Airway & Oxygen Therapy: Patient Spontanous Breathing and Patient connected to face mask oxygen  Post-op Assessment: Report given to RN and Post -op Vital signs reviewed and stable  Post vital signs: stable  Last Vitals:  Vitals Value Taken Time  BP 94/76 07/26/20 0938  Temp    Pulse 67 07/26/20 0941  Resp 17 07/26/20 0941  SpO2 100 % 07/26/20 0941  Vitals shown include unvalidated device data.  Last Pain:  Vitals:   07/26/20 0618  TempSrc: Oral  PainSc: 3          Complications: No complications documented.

## 2020-07-26 NOTE — Progress Notes (Addendum)
Pt c/o scalp itching and throat scratchiness. Dr. Rosey Bath at bedside and aware. Vanc stopped and discontinued as well as tubing. Pt denies any SOB. Ancef is recommended per Dr. Modena Nunnery, RN  In [place] made aware

## 2020-07-26 NOTE — Op Note (Signed)
  PATIENT:  Rebecca Adkins  PRE-OPERATIVE DIAGNOSIS:  TEAR OF MEDIAL MENISCUS, LEFT KNEE  POST-OPERATIVE DIAGNOSIS:  Same  PROCEDURE:  LEFT KNEE ARTHROSCOPY WITH  Partial MEDIAL MENISECTOMY, synovectomy  SURGEON:  Thornton Park, MD  ANESTHESIA:   General  PREOPERATIVE INDICATIONS:  Rebecca Adkins  29 y.o. female with a diagnosis of TEAR OF MEDIAL MENISCUS who failed conservative management and elected for surgical management.    The risks benefits and alternatives were discussed with the patient preoperatively including the risks of infection, bleeding, nerve injury, knee stiffness, persistent pain, osteoarthritis and the need for further surgery. Medical  risks include DVT and pulmonary embolism, myocardial infarction, stroke, pneumonia, respiratory failure and death. The patient understood these risks and wished to proceed.  OPERATIVE FINDINGS: Undersurface tear of the medial meniscus, posterior horn and synovitis with hypertrophy of Hoffa's fat pad.  OPERATIVE PROCEDURE: Patient was met in the preoperative area. The operative extremity was signed with the word yes and my initials according the hospital's correct site of surgery protocol.  The patient was brought to the operating room where they was placed supine on the operative table. General anesthesia was administered. The patient was prepped and draped in a sterile fashion.  A timeout was performed to verify the patient's name, date of birth, medical record number, correct site of surgery correct procedure to be performed. It was also used to verify the patient received antibiotics that all appropriate instruments, and radiographic studies were available in the room. Once all in attendance were in agreement, the case began.  Proposed arthroscopy incisions were drawn out with a surgical marker. These were pre-injected with 1% lidocaine plain. An 11 blade was used to establish an inferior lateral and inferomedial portals. The  inferomedial portal was created using a 18-gauge spinal needle under direct visualization.  A full diagnostic examination of the knee was performed including the suprapatellar pouch, patellofemoral joint, medial lateral compartments as well as the medial lateral gutters, the intercondylar notch in the posterior knee.  The undersurface of the posterior horn of the medial meniscus was found to have a tear with probing.  The meniscal tear was treated with a 3.5 resector shaver blade and straight duckbill basket. The meniscus was debrided until a stable rim was achieved.  A partial synovectomy and debridement of Hoffa's fat pad was also performed using a 4.0 resector shaver blade and 90 ArthroCare wand.  The knee was then copiously lavaged. All arthroscopic instruments were removed. The 2 arthroscopy portals were closed with 4-0 nylon. Steri-Strips were applied along with a dry sterile and compressive dressing. The patient was brought to the PACU in stable condition. I was scrubbed and present for the entire case and all sharp and instrument counts were correct at the conclusion the case. I called the patient's significant other by phone from the PACU and left a message to let them know the case was performed without complication and the patient was stable in the recovery room.   Timoteo Gaul, MD

## 2020-07-26 NOTE — Anesthesia Postprocedure Evaluation (Signed)
Anesthesia Post Note  Patient: [name]  Procedure(s) Performed: LEFT KNEE ARTHROSCOPY WITH  PARTIAL MEDIAL MENISECTOMY (Left Knee)  Patient location during evaluation: PACU Anesthesia Type: General Level of consciousness: awake and alert Pain management: pain level controlled Vital Signs Assessment: post-procedure vital signs reviewed and stable Respiratory status: spontaneous breathing, nonlabored ventilation, respiratory function stable and patient connected to nasal cannula oxygen Cardiovascular status: blood pressure returned to baseline and stable Postop Assessment: no apparent nausea or vomiting Anesthetic complications: no   No complications documented.   Last Vitals:  Vitals:   07/26/20 1026 07/26/20 1044  BP: 113/78 108/64  Pulse: 65 65  Resp: 16 16  Temp: 36.5 C   SpO2: 99% 100%    Last Pain:  Vitals:   07/26/20 1044  TempSrc:   PainSc: 3                  Martha Clan

## 2020-07-26 NOTE — Anesthesia Preprocedure Evaluation (Signed)
Anesthesia Evaluation  Patient identified by MRN, date of birth, ID band Patient awake    Reviewed: Allergy & Precautions, H&P , NPO status , Patient's Chart, lab work & pertinent test results, reviewed documented beta blocker date and time   History of Anesthesia Complications (+) PONV and history of anesthetic complications  Airway Mallampati: I  TM Distance: >3 FB Neck ROM: full    Dental  (+) Dental Advidsory Given, Teeth Intact, Missing Permanent bridge x2, bilateral top:   Pulmonary neg shortness of breath, neg sleep apnea, neg COPD, neg recent URI, Current Smoker and Patient abstained from smoking.,    Pulmonary exam normal breath sounds clear to auscultation       Cardiovascular Exercise Tolerance: Good negative cardio ROS Normal cardiovascular exam Rhythm:regular Rate:Normal     Neuro/Psych  Headaches, Seizures - (as a child), Well Controlled,  PSYCHIATRIC DISORDERS Depression    GI/Hepatic Neg liver ROS, GERD  ,  Endo/Other  negative endocrine ROS  Renal/GU negative Renal ROS  negative genitourinary   Musculoskeletal   Abdominal   Peds  Hematology negative hematology ROS (+)   Anesthesia Other Findings Past Medical History: No date: Back pain No date: Cancer (HCC)     Comment:  cervical No date: Cervical dysplasia No date: Clavicle fracture     Comment:  left No date: Elevated liver enzymes No date: Family history of BRCA2 gene positive No date: Family history of breast cancer No date: Family history of ovarian cancer No date: GERD (gastroesophageal reflux disease) No date: Headache No date: High cholesterol 01/26/2020: History of 2019 novel coronavirus disease (COVID-19) No date: Joint pain No date: Obesity (BMI 30.0-34.9) No date: Ovarian cyst No date: Pneumonia No date: PONV (postoperative nausea and vomiting) No date: Seizures ([place])     Comment:  as a child only No date: Vitamin D  deficiency   Reproductive/Obstetrics negative OB ROS                             Anesthesia Physical Anesthesia Plan  ASA: II  Anesthesia Plan: General   Post-op Pain Management:    Induction: Intravenous  PONV Risk Score and Plan: 3 and Ondansetron, Dexamethasone, Midazolam, Promethazine and Treatment may vary due to age or medical condition  Airway Management Planned: LMA  Additional Equipment:   Intra-op Plan:   Post-operative Plan: Extubation in OR  Informed Consent: I have reviewed the patients History and Physical, chart, labs and discussed the procedure including the risks, benefits and alternatives for the proposed anesthesia with the patient or authorized representative who has indicated his/her understanding and acceptance.     Dental Advisory Given  Plan Discussed with: Anesthesiologist, CRNA and Surgeon  Anesthesia Plan Comments:         Anesthesia Quick Evaluation

## 2020-07-26 NOTE — Discharge Instructions (Signed)
AMBULATORY SURGERY  °DISCHARGE INSTRUCTIONS ° ° °1) The drugs that you were given will stay in your system until tomorrow so for the next 24 hours you should not: ° °A) Drive an automobile °B) Make any legal decisions °C) Drink any alcoholic beverage ° ° °2) You may resume regular meals tomorrow.  Today it is better to start with liquids and gradually work up to solid foods. ° °You may eat anything you prefer, but it is better to start with liquids, then soup and crackers, and gradually work up to solid foods. ° ° °3) Please notify your doctor immediately if you have any unusual bleeding, trouble breathing, redness and pain at the surgery site, drainage, fever, or pain not relieved by medication. ° ° ° °4) Additional Instructions: ° ° ° ° ° ° ° °Please contact your physician with any problems or Same Day Surgery at [phone], Monday through Friday 6 am to 4 pm, or [address] at [place] Main number at [phone]. °

## 2020-07-27 ENCOUNTER — Encounter: Payer: Self-pay | Admitting: Orthopedic Surgery

## 2020-07-27 NOTE — Progress Notes (Signed)
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 She stated she was told that it may happen due to the Scopalamine patch but she wanted to know how long does it last. I told her to remove the patch and call Dr. Harden Mo office asap.

## 2020-08-31 ENCOUNTER — Inpatient Hospital Stay: Admission: RE | Admit: 2020-08-31 | Payer: 59 | Source: Ambulatory Visit

## 2020-09-04 ENCOUNTER — Encounter
Admission: RE | Admit: 2020-09-04 | Discharge: 2020-09-04 | Disposition: A | Discharge status: Home / Self Care | Payer: 59 | Source: Ambulatory Visit | Attending: Orthopedic Surgery | Admitting: Orthopedic Surgery

## 2020-09-04 NOTE — Patient Instructions (Addendum)
Your procedure is scheduled on: 09/11/20- Tuesday Report to the Registration Desk on the 1st floor of the [facility]. To find out your arrival time, please call [phone] between 1PM - 3PM on: 09/10/20- Monday  REMEMBER: Instructions that are not followed completely may result in serious medical risk, up to and including death; or upon the discretion of your surgeon and anesthesiologist your surgery may need to be rescheduled.  Do not eat food after midnight the night before surgery.  No gum chewing, lozengers or hard candies.  You may however, drink CLEAR liquids up to 2 hours before you are scheduled to arrive for your surgery. Do not drink anything within 2 hours of your scheduled arrival time.  Clear liquids include: - water  - apple juice without pulp - gatorade (not RED, PURPLE, OR BLUE) - black coffee or tea (Do NOT add milk or creamers to the coffee or tea) Do NOT drink anything that is not on this list.   TAKE THESE MEDICATIONS THE MORNING OF SURGERY WITH A SIP OF WATER: - acetaminophen (TYLENOL) 500 MG tablet as directed if needed - famotidine (PEPCID) 10 MG tablet, take one the night before and one on the morning of surgery - helps to prevent nausea after surgery.  Stop metFORMIN (GLUCOPHAGE) 500 MG tablet 2 days prior to surgery.  One week prior to surgery: Stop Anti-inflammatories (NSAIDS) such as Advil, Aleve, Ibuprofen, Motrin, Naproxen, Naprosyn and Aspirin based products such as Excedrin, Goodys Powder, BC Powder  Stop ANY OVER THE COUNTER supplements until after surgery. (However, you may continue taking Vitamin D, Vitamin B, and multivitamin up until the day before surgery.)  No Alcohol for 24 hours before or after surgery.  No Smoking including e-cigarettes for 24 hours prior to surgery.  No chewable tobacco products for at least 6 hours prior to surgery.  No nicotine patches on the day of surgery.  Do not use any "recreational" drugs for at least a  week prior to your surgery.  Please be advised that the combination of cocaine and anesthesia may have negative outcomes, up to and including death. If you test positive for cocaine, your surgery will be cancelled.  On the morning of surgery brush your teeth with toothpaste and water, you may rinse your mouth with mouthwash if you wish. Do not swallow any toothpaste or mouthwash.  Do not wear jewelry, make-up, hairpins, clips or nail polish.  Do not wear lotions, powders, or perfumes.   Do not shave body from the neck down 48 hours prior to surgery just in case you cut yourself which could leave a site for infection.  Also, freshly shaved skin may become irritated if using the CHG soap.  Contact lenses, hearing aids and dentures may not be worn into surgery.  Do not bring valuables to the hospital. [facility] is not responsible for any missing/lost belongings or valuables.   Use CHG Soap or wipes as directed on instruction sheet.  Total Shoulder Arthroplasty:  use Benzolyl Peroxide 5% Gel as directed on instruction sheet.  Notify your doctor if there is any change in your medical condition (cold, fever, infection).  Wear comfortable clothing (specific to your surgery type) to the hospital.  Plan for stool softeners for home use; pain medications have a tendency to cause constipation. You can also help prevent constipation by eating foods high in fiber such as fruits and vegetables and drinking plenty of fluids as your diet allows.  After surgery, you can help  prevent lung complications by doing breathing exercises.  Take deep breaths and cough every 1-2 hours. Your doctor may order a device called an Incentive Spirometer to help you take deep breaths. When coughing or sneezing, hold a pillow firmly against your incision with both hands. This is called "splinting." Doing this helps protect your incision. It also decreases belly discomfort.  If you are being admitted to the hospital  overnight, leave your suitcase in the car. After surgery it may be brought to your room.  If you are being discharged the day of surgery, you will not be allowed to drive home. You will need a responsible adult (18 years or older) to drive you home and stay with you that night.   If you are taking public transportation, you will need to have a responsible adult (18 years or older) with you. Please confirm with your physician that it is acceptable to use public transportation.   Please call the [facility] at [phone] if you have any questions about these instructions.  Visitation Policy:  Patients undergoing a surgery or procedure may have one family member or support person with them as long as that person is not COVID-19 positive or experiencing its symptoms.  That person may remain in the waiting area during the procedure.  Inpatient Visitation Update:   In an effort to ensure the safety of our team members and our patients, we are implementing a change to our visitation policy:  Effective Monday, Aug. 9, at 7 a.m., inpatients will be allowed one support person.  o The support person may change daily.  o The support person must pass our screening, gel in and out, and wear a mask at all times, including in the patient's room.  o Patients must also wear a mask when staff or their support person are in the room.  o Masking is required regardless of vaccination status.  Systemwide, no visitors 17 or younger.

## 2020-09-05 ENCOUNTER — Other Ambulatory Visit: Payer: Self-pay | Admitting: Orthopedic Surgery

## 2020-09-07 ENCOUNTER — Other Ambulatory Visit
Admission: RE | Admit: 2020-09-07 | Discharge: 2020-09-07 | Disposition: A | Discharge status: Home / Self Care | Payer: 59 | Source: Ambulatory Visit | Attending: Orthopedic Surgery | Admitting: Orthopedic Surgery

## 2020-09-07 ENCOUNTER — Other Ambulatory Visit: Payer: Self-pay

## 2020-09-07 DIAGNOSIS — Z20822 Contact with and (suspected) exposure to covid-19: Secondary | ICD-10-CM | POA: Diagnosis not present

## 2020-09-07 DIAGNOSIS — Z01812 Encounter for preprocedural laboratory examination: Secondary | ICD-10-CM | POA: Insufficient documentation

## 2020-09-07 LAB — SARS CORONAVIRUS 2 (TAT 6-24 HRS): SARS Coronavirus 2: NEGATIVE

## 2020-09-11 ENCOUNTER — Ambulatory Visit: Payer: 59

## 2020-09-11 ENCOUNTER — Ambulatory Visit: Payer: 59 | Admitting: Anesthesiology

## 2020-09-11 ENCOUNTER — Other Ambulatory Visit: Payer: Self-pay

## 2020-09-11 ENCOUNTER — Encounter
Admission: RE | Disposition: A | Discharge status: Home / Self Care | Payer: Self-pay | Source: Home / Self Care | Attending: Orthopedic Surgery

## 2020-09-11 ENCOUNTER — Ambulatory Visit
Admission: RE | Admit: 2020-09-11 | Discharge: 2020-09-11 | Disposition: A | Discharge status: Home / Self Care | Payer: 59 | Attending: Orthopedic Surgery | Admitting: Orthopedic Surgery

## 2020-09-11 ENCOUNTER — Encounter: Payer: Self-pay | Admitting: Orthopedic Surgery

## 2020-09-11 DIAGNOSIS — Z7982 Long term (current) use of aspirin: Secondary | ICD-10-CM | POA: Diagnosis not present

## 2020-09-11 DIAGNOSIS — Z8616 Personal history of COVID-19: Secondary | ICD-10-CM | POA: Insufficient documentation

## 2020-09-11 DIAGNOSIS — F17210 Nicotine dependence, cigarettes, uncomplicated: Secondary | ICD-10-CM | POA: Diagnosis not present

## 2020-09-11 DIAGNOSIS — Z7984 Long term (current) use of oral hypoglycemic drugs: Secondary | ICD-10-CM | POA: Diagnosis not present

## 2020-09-11 DIAGNOSIS — S43432A Superior glenoid labrum lesion of left shoulder, initial encounter: Secondary | ICD-10-CM | POA: Diagnosis present

## 2020-09-11 DIAGNOSIS — Z8541 Personal history of malignant neoplasm of cervix uteri: Secondary | ICD-10-CM | POA: Insufficient documentation

## 2020-09-11 DIAGNOSIS — X58XXXA Exposure to other specified factors, initial encounter: Secondary | ICD-10-CM | POA: Diagnosis not present

## 2020-09-11 DIAGNOSIS — M75112 Incomplete rotator cuff tear or rupture of left shoulder, not specified as traumatic: Secondary | ICD-10-CM | POA: Insufficient documentation

## 2020-09-11 DIAGNOSIS — Z79899 Other long term (current) drug therapy: Secondary | ICD-10-CM | POA: Diagnosis not present

## 2020-09-11 DIAGNOSIS — M25512 Pain in left shoulder: Secondary | ICD-10-CM

## 2020-09-11 HISTORY — PX: SHOULDER ARTHROSCOPY WITH LABRAL REPAIR: SHX5691

## 2020-09-11 LAB — URINE DRUG SCREEN, QUALITATIVE (ARMC ONLY)
Amphetamines, Ur Screen: NOT DETECTED
Barbiturates, Ur Screen: NOT DETECTED
Benzodiazepine, Ur Scrn: NOT DETECTED
Cannabinoid 50 Ng, Ur ~~LOC~~: POSITIVE — AB
Cocaine Metabolite,Ur ~~LOC~~: NOT DETECTED
MDMA (Ecstasy)Ur Screen: NOT DETECTED
Methadone Scn, Ur: NOT DETECTED
Opiate, Ur Screen: NOT DETECTED
Phencyclidine (PCP) Ur S: NOT DETECTED
Tricyclic, Ur Screen: NOT DETECTED

## 2020-09-11 LAB — POCT PREGNANCY, URINE: Preg Test, Ur: NEGATIVE

## 2020-09-11 SURGERY — ARTHROSCOPY, SHOULDER, WITH GLENOID LABRUM REPAIR
Anesthesia: General | Site: Shoulder | Laterality: Left

## 2020-09-11 MED ORDER — BUPIVACAINE HCL (PF) 0.5 % IJ SOLN
INTRAMUSCULAR | Status: AC
  Filled 2020-09-11: qty 10

## 2020-09-11 MED ORDER — DEXMEDETOMIDINE (PRECEDEX) IN NS 20 MCG/5ML (4 MCG/ML) IV SYRINGE
PREFILLED_SYRINGE | INTRAVENOUS | Status: AC
  Filled 2020-09-11: qty 5

## 2020-09-11 MED ORDER — FENTANYL CITRATE (PF) 100 MCG/2ML IJ SOLN: 50.0000 ug | Freq: Once | INTRAMUSCULAR | Status: AC

## 2020-09-11 MED ORDER — PROPOFOL 500 MG/50ML IV EMUL
INTRAVENOUS | Status: AC
  Filled 2020-09-11: qty 50

## 2020-09-11 MED ORDER — SUGAMMADEX SODIUM 200 MG/2ML IV SOLN
INTRAVENOUS | Status: DC | PRN
  Administered 2020-09-11: 200 mg via INTRAVENOUS

## 2020-09-11 MED ORDER — OXYCODONE HCL 5 MG PO TABS: 5.0000 mg | ORAL_TABLET | Freq: Once | ORAL | Status: DC | PRN

## 2020-09-11 MED ORDER — LACTATED RINGERS IV SOLN: INTRAVENOUS | Status: DC

## 2020-09-11 MED ORDER — SODIUM CHLORIDE 0.9 % IV SOLN
INTRAVENOUS | Status: DC | PRN
  Administered 2020-09-11: 13:00:00 50 ug/min via INTRAVENOUS

## 2020-09-11 MED ORDER — ORAL CARE MOUTH RINSE: 15.0000 mL | Freq: Once | OROMUCOSAL | Status: AC

## 2020-09-11 MED ORDER — OXYCODONE HCL 5 MG/5ML PO SOLN: 5.0000 mg | Freq: Once | ORAL | Status: DC | PRN

## 2020-09-11 MED ORDER — DEXAMETHASONE SODIUM PHOSPHATE 10 MG/ML IJ SOLN
INTRAMUSCULAR | Status: DC | PRN
  Administered 2020-09-11: 8 mg via INTRAVENOUS

## 2020-09-11 MED ORDER — CHLORHEXIDINE GLUCONATE 0.12 % MT SOLN: 15.0000 mL | Freq: Once | OROMUCOSAL | Status: AC

## 2020-09-11 MED ORDER — MIDAZOLAM HCL 2 MG/2ML IJ SOLN
INTRAMUSCULAR | Status: AC
  Administered 2020-09-11: 12:00:00 1 mg via INTRAVENOUS
  Filled 2020-09-11: qty 2

## 2020-09-11 MED ORDER — CHLORHEXIDINE GLUCONATE CLOTH 2 % EX PADS: 6.0000 | MEDICATED_PAD | Freq: Once | CUTANEOUS | Status: DC

## 2020-09-11 MED ORDER — MIDAZOLAM HCL 2 MG/2ML IJ SOLN
INTRAMUSCULAR | Status: AC
  Filled 2020-09-11: qty 2

## 2020-09-11 MED ORDER — ONDANSETRON HCL 4 MG PO TABS: 4.0000 mg | ORAL_TABLET | Freq: Three times a day (TID) | ORAL | 0 refills | Status: DC | PRN

## 2020-09-11 MED ORDER — PROPOFOL 10 MG/ML IV BOLUS
INTRAVENOUS | Status: DC | PRN
  Administered 2020-09-11: 200 mg via INTRAVENOUS

## 2020-09-11 MED ORDER — FENTANYL CITRATE (PF) 100 MCG/2ML IJ SOLN
25.0000 ug | INTRAMUSCULAR | Status: DC | PRN
Start: 2020-09-11 — End: 2020-09-11

## 2020-09-11 MED ORDER — SCOPOLAMINE 1 MG/3DAYS TD PT72
MEDICATED_PATCH | TRANSDERMAL | Status: AC
  Administered 2020-09-11: 12:00:00 1.5 mg via TRANSDERMAL
  Filled 2020-09-11: qty 1

## 2020-09-11 MED ORDER — ONDANSETRON HCL 4 MG/2ML IJ SOLN
INTRAMUSCULAR | Status: AC
  Filled 2020-09-11: qty 2

## 2020-09-11 MED ORDER — CEFAZOLIN SODIUM 1 G IJ SOLR
INTRAMUSCULAR | Status: AC
  Filled 2020-09-11: qty 20

## 2020-09-11 MED ORDER — PROPOFOL 10 MG/ML IV BOLUS
INTRAVENOUS | Status: AC
  Filled 2020-09-11: qty 20

## 2020-09-11 MED ORDER — PROPOFOL 500 MG/50ML IV EMUL
INTRAVENOUS | Status: DC | PRN
  Administered 2020-09-11: 150 ug/kg/min via INTRAVENOUS

## 2020-09-11 MED ORDER — FENTANYL CITRATE (PF) 100 MCG/2ML IJ SOLN
INTRAMUSCULAR | Status: AC
  Administered 2020-09-11: 12:00:00 50 ug via INTRAVENOUS
  Filled 2020-09-11: qty 2

## 2020-09-11 MED ORDER — LIDOCAINE HCL (PF) 1 % IJ SOLN
INTRAMUSCULAR | Status: AC
  Filled 2020-09-11: qty 5

## 2020-09-11 MED ORDER — BUPIVACAINE HCL (PF) 0.25 % IJ SOLN
INTRAMUSCULAR | Status: AC
  Filled 2020-09-11: qty 30

## 2020-09-11 MED ORDER — PROMETHAZINE HCL 25 MG/ML IJ SOLN: 6.2500 mg | INTRAMUSCULAR | Status: DC | PRN

## 2020-09-11 MED ORDER — LIDOCAINE HCL (PF) 1 % IJ SOLN
INTRAMUSCULAR | Status: DC | PRN
  Administered 2020-09-11: 2 mL

## 2020-09-11 MED ORDER — EPINEPHRINE PF 1 MG/ML IJ SOLN
INTRAMUSCULAR | Status: AC
  Filled 2020-09-11: qty 4

## 2020-09-11 MED ORDER — BUPIVACAINE LIPOSOME 1.3 % IJ SUSP
INTRAMUSCULAR | Status: AC
  Filled 2020-09-11: qty 20

## 2020-09-11 MED ORDER — BUPIVACAINE HCL (PF) 0.5 % IJ SOLN
INTRAMUSCULAR | Status: DC | PRN
Start: 2020-09-11 — End: 2020-09-11
  Administered 2020-09-11 (×2): 5 mL

## 2020-09-11 MED ORDER — FENTANYL CITRATE (PF) 100 MCG/2ML IJ SOLN
INTRAMUSCULAR | Status: DC | PRN
  Administered 2020-09-11 (×2): 50 ug via INTRAVENOUS

## 2020-09-11 MED ORDER — LIDOCAINE HCL (PF) 1 % IJ SOLN
INTRAMUSCULAR | Status: AC
  Filled 2020-09-11: qty 30

## 2020-09-11 MED ORDER — MIDAZOLAM HCL 2 MG/2ML IJ SOLN: 1.0000 mg | Freq: Once | INTRAMUSCULAR | Status: AC

## 2020-09-11 MED ORDER — BUPIVACAINE LIPOSOME 1.3 % IJ SUSP
INTRAMUSCULAR | Status: DC | PRN
  Administered 2020-09-11 (×4): 5 mL

## 2020-09-11 MED ORDER — LIDOCAINE HCL (PF) 2 % IJ SOLN
INTRAMUSCULAR | Status: AC
  Filled 2020-09-11: qty 5

## 2020-09-11 MED ORDER — SCOPOLAMINE 1 MG/3DAYS TD PT72: 1.0000 | MEDICATED_PATCH | TRANSDERMAL | Status: DC

## 2020-09-11 MED ORDER — ACETAMINOPHEN 500 MG PO TABS
ORAL_TABLET | ORAL | Status: AC
  Administered 2020-09-11: 11:00:00 1000 mg via ORAL
  Filled 2020-09-11: qty 2

## 2020-09-11 MED ORDER — ONDANSETRON HCL 4 MG/2ML IJ SOLN
INTRAMUSCULAR | Status: DC | PRN
  Administered 2020-09-11: 4 mg via INTRAVENOUS

## 2020-09-11 MED ORDER — CHLORHEXIDINE GLUCONATE 0.12 % MT SOLN
OROMUCOSAL | Status: AC
  Administered 2020-09-11: 11:00:00 15 mL via OROMUCOSAL
  Filled 2020-09-11: qty 15

## 2020-09-11 MED ORDER — ROCURONIUM BROMIDE 10 MG/ML (PF) SYRINGE
PREFILLED_SYRINGE | INTRAVENOUS | Status: AC
  Filled 2020-09-11: qty 10

## 2020-09-11 MED ORDER — ACETAMINOPHEN 500 MG PO TABS: 1000.0000 mg | ORAL_TABLET | ORAL | Status: AC

## 2020-09-11 MED ORDER — PROPOFOL 500 MG/50ML IV EMUL
INTRAVENOUS | Status: AC
  Filled 2020-09-11: qty 100

## 2020-09-11 MED ORDER — OXYCODONE HCL 5 MG PO TABS: 5.0000 mg | ORAL_TABLET | ORAL | 0 refills | Status: DC | PRN

## 2020-09-11 MED ORDER — FENTANYL CITRATE (PF) 100 MCG/2ML IJ SOLN
INTRAMUSCULAR | Status: AC
  Filled 2020-09-11: qty 2

## 2020-09-11 MED ORDER — DEXAMETHASONE SODIUM PHOSPHATE 10 MG/ML IJ SOLN
INTRAMUSCULAR | Status: AC
  Filled 2020-09-11: qty 1

## 2020-09-11 MED ORDER — ROCURONIUM BROMIDE 100 MG/10ML IV SOLN
INTRAVENOUS | Status: DC | PRN
  Administered 2020-09-11: 50 mg via INTRAVENOUS

## 2020-09-11 MED ORDER — DEXMEDETOMIDINE HCL IN NACL 200 MCG/50ML IV SOLN
INTRAVENOUS | Status: DC | PRN
  Administered 2020-09-11: 12 ug via INTRAVENOUS
  Administered 2020-09-11: 8 ug via INTRAVENOUS

## 2020-09-11 MED ORDER — CEFAZOLIN SODIUM-DEXTROSE 2-3 GM-%(50ML) IV SOLR
INTRAVENOUS | Status: DC | PRN
  Administered 2020-09-11: 2 g via INTRAVENOUS

## 2020-09-11 MED ORDER — MIDAZOLAM HCL 2 MG/2ML IJ SOLN
INTRAMUSCULAR | Status: DC | PRN
  Administered 2020-09-11: 2 mg via INTRAVENOUS

## 2020-09-11 MED ORDER — LACTATED RINGERS IV SOLN
INTRAVENOUS | Status: DC | PRN
  Administered 2020-09-11 (×4): 1 mL

## 2020-09-11 SURGICAL SUPPLY — 73 items
ADAPTER IRRIG TUBE 2 SPIKE SOL (ADAPTER) ×6 IMPLANT
ADPR TBG 2 SPK PMP STRL ASCP (ADAPTER) ×2
ANCH SUT 5.5 KNTLS PEEK (Orthopedic Implant) ×1 IMPLANT
ANCH SUT Q-FX 2.8 (Anchor) ×1 IMPLANT
ANCHOR ALL-SUT Q-FIX 2.8 (Anchor) ×3 IMPLANT
ANCHOR SUT 5.5 MULTIFIX (Orthopedic Implant) ×2 IMPLANT
ANCHOR SUT 5.5MM MULTIFIX (Orthopedic Implant) ×1 IMPLANT
BUR RADIUS 4.0X18.5 (BURR) ×3 IMPLANT
BUR RADIUS 5.5 (BURR) ×3 IMPLANT
CANISTER SUCT LVC 12 LTR MEDI- (MISCELLANEOUS) IMPLANT
CANNULA 5.75X7 CRYSTAL CLEAR (CANNULA) ×6 IMPLANT
CANNULA PARTIAL THREAD 2X7 (CANNULA) ×3 IMPLANT
CANNULA TWIST IN 8.25X9CM (CANNULA) IMPLANT
CLOSURE WOUND 1/2 X4 (GAUZE/BANDAGES/DRESSINGS) ×1
CONNECTOR PERFECT PASSER (CONNECTOR) ×3 IMPLANT
COOLER POLAR GLACIER W/PUMP (MISCELLANEOUS) ×3 IMPLANT
COVER WAND RF STERILE (DRAPES) ×3 IMPLANT
DEVICE SUCT BLK HOLE OR FLOOR (MISCELLANEOUS) IMPLANT
DRAPE 3/4 80X56 (DRAPES) ×3 IMPLANT
DRAPE IMP U-DRAPE 54X76 (DRAPES) ×6 IMPLANT
DRAPE INCISE IOBAN 66X45 STRL (DRAPES) ×3 IMPLANT
DRAPE U-SHAPE 47X51 STRL (DRAPES) IMPLANT
DURAPREP 26ML APPLICATOR (WOUND CARE) ×9 IMPLANT
ELECT REM PT RETURN 9FT ADLT (ELECTROSURGICAL)
ELECTRODE REM PT RTRN 9FT ADLT (ELECTROSURGICAL) IMPLANT
GAUZE SPONGE 4X4 12PLY STRL (GAUZE/BANDAGES/DRESSINGS) ×3 IMPLANT
GAUZE XEROFORM 1X8 LF (GAUZE/BANDAGES/DRESSINGS) ×3 IMPLANT
GLOVE BIOGEL PI IND STRL 9 (GLOVE) ×1 IMPLANT
GLOVE BIOGEL PI INDICATOR 9 (GLOVE) ×2
GLOVE SURG 9.0 ORTHO LTXF (GLOVE) ×6 IMPLANT
GOWN STRL REUS TWL 2XL XL LVL4 (GOWN DISPOSABLE) ×3 IMPLANT
GOWN STRL REUS W/ TWL LRG LVL3 (GOWN DISPOSABLE) ×1 IMPLANT
GOWN STRL REUS W/ TWL LRG LVL4 (GOWN DISPOSABLE) ×1 IMPLANT
GOWN STRL REUS W/TWL LRG LVL3 (GOWN DISPOSABLE) ×3
GOWN STRL REUS W/TWL LRG LVL4 (GOWN DISPOSABLE) ×3
IV LACTATED RINGER IRRG 3000ML (IV SOLUTION) ×30
IV LR IRRIG 3000ML ARTHROMATIC (IV SOLUTION) ×10 IMPLANT
KIT STABILIZATION SHOULDER (MISCELLANEOUS) ×3 IMPLANT
KIT SUTURE 2.8 Q-FIX DISP (MISCELLANEOUS) ×3 IMPLANT
KIT SUTURETAK 3.0 INSERT PERC (KITS) ×3 IMPLANT
KIT TURNOVER KIT A (KITS) ×3 IMPLANT
MANIFOLD NEPTUNE II (INSTRUMENTS) ×6 IMPLANT
MASK FACE SPIDER DISP (MASK) ×3 IMPLANT
MAT ABSORB  FLUID 56X50 GRAY (MISCELLANEOUS) ×4
MAT ABSORB FLUID 56X50 GRAY (MISCELLANEOUS) ×2 IMPLANT
NEEDLE HYPO 22GX1.5 SAFETY (NEEDLE) ×3 IMPLANT
PACK ARTHROSCOPY SHOULDER (MISCELLANEOUS) ×3 IMPLANT
PAD ARMBOARD 7.5X6 YLW CONV (MISCELLANEOUS) ×3 IMPLANT
PAD WRAPON POLAR SHDR XLG (MISCELLANEOUS) ×1 IMPLANT
PASSER SUT FIRSTPASS SELF (INSTRUMENTS) ×3 IMPLANT
SET TUBE SUCT SHAVER OUTFL 24K (TUBING) ×3 IMPLANT
SET TUBE TIP INTRA-ARTICULAR (MISCELLANEOUS) ×3 IMPLANT
STRAP SAFETY 5IN WIDE (MISCELLANEOUS) ×3 IMPLANT
STRIP CLOSURE SKIN 1/2X4 (GAUZE/BANDAGES/DRESSINGS) ×2 IMPLANT
SUT ETHILON 4-0 (SUTURE) ×6
SUT ETHILON 4-0 FS2 18XMFL BLK (SUTURE) ×2
SUT LASSO 90 DEG SD STR (SUTURE) ×3 IMPLANT
SUT MNCRL 4-0 (SUTURE) ×3
SUT MNCRL 4-0 27XMFL (SUTURE) ×1
SUT PDS AB 0 CT1 27 (SUTURE) ×3 IMPLANT
SUT PERFECTPASSER WHITE CART (SUTURE) ×6 IMPLANT
SUT ULTRABRAID 2 COBRAID 38 (SUTURE) IMPLANT
SUT VIC AB 0 CT1 36 (SUTURE) ×3 IMPLANT
SUT VIC AB 2-0 CT2 27 (SUTURE) ×3 IMPLANT
SUTURE ETHLN 4-0 FS2 18XMF BLK (SUTURE) ×2 IMPLANT
SUTURE MAGNUM WIRE 2X48 BLK (SUTURE) IMPLANT
SUTURE MNCRL 4-0 27XMF (SUTURE) ×1 IMPLANT
TAPE MICROFOAM 4IN (TAPE) ×3 IMPLANT
TUBING ARTHRO INFLOW-ONLY STRL (TUBING) ×3 IMPLANT
TUBING CONNECTING 10 (TUBING) ×2 IMPLANT
TUBING CONNECTING 10' (TUBING) ×1
WAND HAND CNTRL MULTIVAC 90 (MISCELLANEOUS) ×3 IMPLANT
WRAPON POLAR PAD SHDR XLG (MISCELLANEOUS) ×3

## 2020-09-11 NOTE — Anesthesia Procedure Notes (Signed)
Anesthesia Regional Block: Interscalene brachial plexus block   Pre-Anesthetic Checklist: ,, timeout performed, Correct Patient, Correct Site, Correct Laterality, Correct Procedure, Correct Position, site marked, Risks and benefits discussed,  Surgical consent,  Pre-op evaluation,  At surgeon's request and post-op pain management  Laterality: Left  Prep: chloraprep       Needles:  Injection technique: Single-shot  Needle Type: Stimiplex     Needle Length: 9cm  Needle Gauge: 21     Additional Needles:   Procedures:,,,, ultrasound used (permanent image in chart),,,,  Narrative:  Start time: 09/11/2020 12:00 PM End time: 09/11/2020 12:05 PM  Performed by: Personally  Anesthesiologist: Tera Mater, MD  Additional Notes: Risks and benefits of nerve block discussed with patient, including but not limited to risk of nerve injury, bleeding, infection, and failed block.  Patient expressed understanding and consented to block placement.   Functioning IV was confirmed and monitors were applied.  Sterile prep,hand hygiene and sterile gloves were used.  Minimal sedation used for procedure.  During the procedure, there was negative aspiration, negative paresthesia on injection, and dose was given in divided aliquots under ultrasound guidance.  Patient tolerated the procedure well with no immediate complications.

## 2020-09-11 NOTE — H&P (Signed)
PREOPERATIVE H&P  Chief Complaint: Left Shoulder SLAP Tear and partial rotator cuff tear  HPI: Rebecca Adkins is a 29 y.o. female who presents for preoperative history and physical with a diagnosis of Left Shoulder SLAP Tear. Symptoms of pain and limited ROM are significantly impairing activities of daily living.  Patient has failed nonoperative management.  An MRI has demonstrated a type II SLAP tear with partial thickness tear of the rotator cuff.   She was to proceed with surgical fixation of her SLAP tear and possible rotator cuff repair.   Past Medical History:  Diagnosis Date  . Back pain   . Cancer (HCC)    cervical  . Cervical dysplasia   . Clavicle fracture    left  . Elevated liver enzymes   . Family history of BRCA2 gene positive   . Family history of breast cancer   . Family history of ovarian cancer   . GERD (gastroesophageal reflux disease)   . Headache   . High cholesterol   . History of 2019 novel coronavirus disease (COVID-19) 01/26/2020  . Joint pain   . Obesity (BMI 30.0-34.9)   . Ovarian cyst   . Pneumonia   . PONV (postoperative nausea and vomiting)   . Seizures ([place])    as a child only  . Vitamin D deficiency    Past Surgical History:  Procedure Laterality Date  . BUNIONECTOMY    . DILATION AND CURETTAGE OF UTERUS    . FRACTURE SURGERY    . HARDWARE REMOVAL     left clavicle  . JOINT REPLACEMENT  07/26/2020  . KNEE ARTHROSCOPY WITH MEDIAL MENISECTOMY Left 07/26/2020   Procedure: LEFT KNEE ARTHROSCOPY WITH  PARTIAL MEDIAL MENISECTOMY;  Surgeon: Thornton Park, MD;  Location: [facility] ORS;  Service: Orthopedics;  Laterality: Left;  . LEEP    . ORIF CLAVICULAR FRACTURE Left 08/21/2016   Procedure: OPEN REDUCTION INTERNAL FIXATION (ORIF) LEFT CLAVICLE FRACTURE;  Surgeon: Leandrew Koyanagi, MD;  Location: [facility];  Service: Orthopedics;  Laterality: Left;  . OVARY SURGERY     cyst removed  . WISDOM TOOTH EXTRACTION     Social History   Socioeconomic History   . Marital status: Significant Other    Spouse name: brian  . Number of children: Not on file  . Years of education: Not on file  . Highest education level: Not on file  Occupational History  . Occupation: work from home - [employer]  Tobacco Use  . Smoking status: Current Some Day Smoker    Packs/day: 0.25    Years: 13.00    Pack years: 3.25    Types: Cigarettes  . Smokeless tobacco: Never Used  Vaping Use  . Vaping Use: Every day  . Substances: Nicotine  Substance and Sexual Activity  . Alcohol use: Yes    Comment: occassional  . Drug use: Not Currently    Comment: cbc gummies  . Sexual activity: Yes    Birth control/protection: Pill  Other Topics Concern  . Not on file  Social History Narrative  . Not on file   Social Determinants of Health   Financial Resource Strain: Not on file  Food Insecurity: Not on file  Transportation Needs: Not on file  Physical Activity: Not on file  Stress: Not on file  Social Connections: Not on file   Family History  Problem Relation Age of Onset  . Hypertension Mother   . Ovarian cancer Mother 67  . Obesity Mother   .  Diabetes Other   . Hypertension Other   . COPD Other   . Other Other   . Breast cancer Sister 62  . BRCA 1/2 Sister        BRCA2 pos  . Hypertension Sister   . Colon cancer Paternal Aunt        dx over 51  . Hypertension Maternal Grandmother   . Diabetes Maternal Grandmother   . Kidney disease Maternal Grandmother   . Stroke Maternal Grandfather   . COPD Paternal Grandmother   . Other Paternal Uncle        infant death  . Breast cancer Other        PGMs sister  . Breast cancer Other        PGMs sister   Allergies  Allergen Reactions  . Articaine Anaphylaxis  . Clindamycin/Lincomycin Anaphylaxis    facial/tongue/throat/eye swelling, hives/rash, "very low blood pressure".  . Ibuprofen Other (See Comments)    Face/tongue swelling, rash, N/V  . Ketorolac Tromethamine Nausea And Vomiting  and Rash  . Sodium Bisulfate Anaphylaxis  . Amoxicillin Hives and Rash  . Nickel Hives and Rash  . Penicillins Hives    Has patient had a PCN reaction causing immediate rash, facial/tongue/throat swelling, SOB or lightheadedness with hypotension: Yes Has patient had a PCN reaction causing severe rash involving mucus membranes or skin necrosis: No Has patient had a PCN reaction that required hospitalization No Has patient had a PCN reaction occurring within the last 10 years: No If all of the above answers are "NO", then may proceed with Cephalosporin use.   . Tramadol Nausea And Vomiting and Rash  . Vancomycin Itching and Rash    Itchy throat   Prior to Admission medications   Medication Sig Start Date End Date Taking? Authorizing Provider  acetaminophen (TYLENOL) 500 MG tablet Take 1,000 mg by mouth every 6 (six) hours as needed for mild pain or moderate pain.   Yes [provider]  famotidine (PEPCID) 10 MG tablet Take 10 mg by mouth 2 (two) times daily as needed for heartburn.    Yes [provider]  aspirin EC 81 MG tablet Take 1 tablet (81 mg total) by mouth daily. Patient not taking: Reported on 08/24/2020 07/26/20   Thornton Park, MD  loratadine (CLARITIN) 10 MG tablet Take 10 mg by mouth daily as needed for allergies.     [provider]  metFORMIN (GLUCOPHAGE) 500 MG tablet Take 500 mg by mouth in the morning and at bedtime. 07/02/20   [provider]  Naphazoline-Pheniramine (VISINE-A OP) Place 1 drop into both eyes daily as needed (allergies).    [provider]  ondansetron (ZOFRAN) 4 MG tablet Take 1 tablet (4 mg total) by mouth every 8 (eight) hours as needed for nausea or vomiting. Patient not taking: Reported on 09/11/2020 07/26/20   Thornton Park, MD  oxyCODONE (OXY IR/ROXICODONE) 5 MG immediate release tablet Take 1 tablet (5 mg total) by mouth every 4 (four) hours as needed. Patient not taking: Reported on 09/11/2020  07/26/20   Thornton Park, MD  Semaglutide, 1 MG/DOSE, (OZEMPIC, 1 MG/DOSE,) 2 MG/1.5ML SOPN Inject 1 mg into the skin every Monday.    [provider]     Positive ROS: All other systems have been reviewed and were otherwise negative with the exception of those mentioned in the HPI and as above.  Physical Exam: General: Alert, no acute distress Cardiovascular: Regular rate and rhythm, no murmurs rubs or gallops.  No pedal edema Respiratory: Clear to auscultation bilaterally, no wheezes rales or rhonchi. No cyanosis, no use of accessory musculature GI: No organomegaly, abdomen is soft and non-tender nondistended with positive bowel sounds. Skin: Skin intact, no lesions within the operative field. Neurologic: Sensation intact distally Psychiatric: Patient is competent for consent with normal mood and affect Lymphatic: No cervical lymphadenopathy  MUSCULOSKELETAL: Left shoulder: Patient pain with forward elevation and abduction at 90 degrees.  She had pain with O'Brien's test.  She had pain with a downward directed force on her abducted shoulder but does not demonstrate obvious weakness of the rotator cuff.  Had pain with impingement testing but no apprehension or instability she has full digital wrist and elbow range of motion, intact sensation to light touch and a palpable radial pulse.  Assessment: Left Shoulder SLAP Tear  Plan: Plan for Procedure(s): LEFT SHOULDER ARTHROSCOPIC SLAP AND POSSIBLE ROTATOR CUFF REPAIR  I reviewed the details of the operation as well as the post-op course with the patient.  A preop history and physical was performed at the bedside today.  I marked the left shoulder according the hospital's correct site of surgery protocol.  I discussed the risks and benefits of surgery. The risks include but are not limited to infection, bleeding, nerve or blood vessel injury, joint stiffness or loss of motion, persistent pain, weakness or instability, retear of  the rotator cuff or labrum, hardware failure and the need for further surgery. Medical risks include but are not limited to DVT and pulmonary embolism, myocardial infarction, stroke, pneumonia, respiratory failure and death. Patient understood these risks and wished to proceed.     Thornton Park, MD   09/11/2020 12:44 PM

## 2020-09-11 NOTE — Discharge Instructions (Signed)
AMBULATORY SURGERY  DISCHARGE INSTRUCTIONS   1) The drugs that you were given will stay in your system until tomorrow so for the next 24 hours you should not:  A) Drive an automobile B) Make any legal decisions C) Drink any alcoholic beverage   2) You may resume regular meals tomorrow.  Today it is better to start with liquids and gradually work up to solid foods.  You may eat anything you prefer, but it is better to start with liquids, then soup and crackers, and gradually work up to solid foods.   3) Please notify your doctor immediately if you have any unusual bleeding, trouble breathing, redness and pain at the surgery site, drainage, fever, or pain not relieved by medication.    4) Additional Instructions:   Please contact your physician with any problems or Same Day Surgery at [phone], Monday through Friday 6 am to 4 pm, or [place] at [place] Main number at [phone].     Interscalene Nerve Block with Exparel  1.  For your surgery you have received an Interscalene Nerve Block with Exparel. 2. Nerve Blocks affect many types of nerves, including nerves that control movement, pain and normal sensation.  You may experience feelings such as numbness, tingling, heaviness, weakness or the inability to move your arm or the feeling or sensation that your arm has "fallen asleep". 3. A nerve block with Exparel can last up to 5 days.  Usually the weakness wears off first.  The tingling and heaviness usually wear off next.  Finally you may start to notice pain.  Keep in mind that this may occur in any order.  Once a nerve block starts to wear off it is usually completely gone within 60 minutes. 4. ISNB may cause mild shortness of breath, a hoarse voice, blurry vision, unequal pupils, or drooping of the face on the same side as the nerve block.  These symptoms will usually resolve with the numbness.  Very rarely the procedure itself can cause mild seizures. 5. If needed, your  surgeon will give you a prescription for pain medication.  It will take about 60 minutes for the oral pain medication to become fully effective.  So, it is recommended that you start taking this medication before the nerve block first begins to wear off, or when you first begin to feel discomfort. 6. Take your pain medication only as prescribed.  Pain medication can cause sedation and decrease your breathing if you take more than you need for the level of pain that you have. 7. Nausea is a common side effect of many pain medications.  You may want to eat something before taking your pain medicine to prevent nausea. 8. After an Interscalene nerve block, you cannot feel pain, pressure or extremes in temperature in the effected arm.  Because your arm is numb it is at an increased risk for injury.  To decrease the possibility of injury, please practice the following:  a. While you are awake change the position of your arm frequently to prevent too much pressure on any one area for prolonged periods of time. b.  If you have a cast or tight dressing, check the color or your fingers every couple of hours.  Call your surgeon with the appearance of any discoloration (white or blue). c. If you are given a sling to wear before you go home, please wear it  at all times until the block has completely worn off.  Do not get up at   night without your sling. d. Please contact [facility] or your surgeon if you do not begin to regain sensation after 7 days from the surgery.  Anesthesia may be contacted by calling the Same Day Surgery Department, Mon. through Fri., 6 am to 4 pm at [phone].   e. If you experience any other problems or concerns, please contact your surgeon's office. f. If you experience severe or prolonged shortness of breath go to the nearest emergency department. 

## 2020-09-11 NOTE — Op Note (Addendum)
09/11/2020  3:51 PM  PATIENT:  Rebecca Adkins  29 y.o. female  PRE-OPERATIVE DIAGNOSIS:  Left Shoulder SLAP Tear and partial thickness rotator cuff tear   POST-OPERATIVE DIAGNOSIS:  Left Shoulder high grade partial rotator cuff tear, no SLAP tear  PROCEDURE:  Procedure(s): LEFT SHOULDER ARTHROSCOPIC SUBACROMIAL DECOMPRESSION WITH MINI-OPEN ROTATOR CUFF REPAIR   SURGEON:  Surgeon(s) and Role:    Thornton Park, MD - Primary  ANESTHESIA:   general and paracervical block   PREOPERATIVE INDICATIONS:  Rebecca Adkins is a  29 y.o. female with a diagnosis of Left Shoulder SLAP Tear and partial rotator cuff tear who failed conservative treatment and elected for surgical management.    The risks benefits and alternatives were discussed with the patient preoperatively including but not limited to the risks of infection, bleeding, nerve injury, persistent pain or weakness, shoulder stiffness/arthrofibrosis, failure of the repair, re-tear of the rotator cuff and the need for further surgery. Medical risks include DVT and pulmonary embolism, myocardial infarction, stroke, pneumonia, respiratory failure and death. Patient understood these risks and wished to proceed.  OPERATIVE IMPLANTS: [employer] Multifix anchor x 1 & [employer] Q Fix anchor x 1  OPERATIVE FINDINGS: High-grade partial-thickness tear of the supraspinatus, no SLAP tear  OPERATIVE PROCEDURE: The patient was met in the preoperative area. The left shoulder was signed with the word yes and my initials according the hospital's correct site of surgery protocol.   A pre-op history and physical was performed at the bedside.  The patient underwent an interscalene block with Exparel by the anesthesia service.  Patient was brought to the operating room where she underwent general anesthesia.  The patient was placed in a beachchair position.  A spider arm positioner was used for this case.  Examination under anesthesia revealed no loss  of passive range of motion or instability with load shift testing. The patient had a negative sulcus sign.  Patient was prepped and draped in a sterile fashion. A timeout was performed to verify the patient's name, date of birth, medical record number, correct site of surgery and correct procedure to be performed there was also used to verify the patient received antibiotics that all appropriate instruments, implants and radiographs studies were available in the room. Once all in attendance were in agreement case began.  Patient received ancef 2 grams IV for pre-op antibiotics.  Bony landmarks were drawn out with a surgical marker along with proposed arthroscopy incisions.  An 11 blade was used to establish a posterior portal through which the arthroscope was placed in the glenohumeral joint. A full diagnostic examination of the shoulder was performed.  Patient was found to have no SLAP tear, but a high-grade partial-thickness tear of the supraspinatus.  Patient no chondral lesions.  Patient had no evidence of a labral tear.  The long head of the biceps tendon was intact.  A 0 PDS suture was placed through the supraspinatus tear using a spinal needle for identification on the bursal side.  The arthroscope was then placed in the subacromial space.  Extensive bursitis was encountered and debrided using a 4-0 resector shaver blade and a 90 ArthroCare wand from a lateral portal which was established under direct visualization using an 18-gauge spinal needle. A subacromial decompression was performed sing a 5.5 mm resector shaver blade from the lateral portal.  The 0 PDS suture was identified.  The rotator cuff tear was completed using a 4.0 mm resector shaver blade.  Two perfect pass sutures  were placed in the lateral border of the rotator cuff.  Images were taken and all arthroscopic instruments were removed.    A saber-type incision was made along the lateral border of the acromion. The deltoid muscle was  identified and split in line with its fibers which allowed visualization of the rotator cuff. The Perfect Pass sutures previously placed in the lateral border of the rotator cuff were brought out through the deltoid split.  A 5.5 mm resector shaver blade was used to debride the greater tuberosity footprint of all torn fibers of the rotator cuff.  Greater tuberosity was debrided until punctate bleeding was identified.  A single [employer] anchor was placed at the articular margin of the humeral head with the greater tuberosity. The suture limbs of the Q Fix anchor were passed medially through the rotator cuff using a First Pass suture passer and clamped with a hemostat for later repair.   The two Perfect Pass sutures in the lateral border of the rotator cuff were then anchored to the greater tuberosity footprint using a single [employer] Multifix anchor. The sutures passed through the Multifix anchors were then tensioned to allow reduction of the rotator cuff to the greater tuberosity footprint. The medial row repair was then performed using the Q fix sutures, which were tied down using an arthroscopic knot tying technique.  Arthroscopic images of the repair were taken with the arthroscope both externally and from inside the glenohumeral joint.  All incisions were copiously irrigated. The deltoid fascia was repaired using a 0 Vicryl suture.  The subcutaneous tissue of all incisions were closed with a 2-0 Vicryl. Skin closure for the arthroscopic incisions was performed with 4-0 nylon. The skin edges of the saber incision was approximated with a running 4-0 undyed Monocryl.  A dry sterile dressing was applied.  The patient was placed in an abduction sling and a Polar Care was applied to the shoulder.  All sharp, sponge and it instrument counts were correct at the conclusion of the case. I was scrubbed and present for the entire case. I spoke with the patient's boyfriend by phone from the PACU  postoperatively to let him know the case had been performed without complication and the patient was stable in recovery room.  I reviewed the postoperative instructions with him.

## 2020-09-11 NOTE — Anesthesia Procedure Notes (Signed)
Procedure Name: Intubation Date/Time: 09/11/2020 12:52 PM Performed by: Lerry Liner, CRNA Pre-anesthesia Checklist: Patient identified, Emergency Drugs available, Suction available and Patient being monitored Patient Re-evaluated:Patient Re-evaluated prior to induction Oxygen Delivery Method: Circle system utilized Preoxygenation: Pre-oxygenation with 100% oxygen Induction Type: IV induction Ventilation: Mask ventilation without difficulty Laryngoscope Size: McGraph and 3 Grade View: Grade I Tube type: Oral Tube size: 7.0 mm Number of attempts: 1 Airway Equipment and Method: Stylet and Oral airway Placement Confirmation: ETT inserted through vocal cords under direct vision,  positive ETCO2 and breath sounds checked- equal and bilateral Secured at: 21 cm Tube secured with: Tape Dental Injury: Teeth and Oropharynx as per pre-operative assessment

## 2020-09-11 NOTE — Anesthesia Preprocedure Evaluation (Addendum)
Anesthesia Evaluation  Patient identified by MRN, date of birth, ID band Patient awake    Reviewed: Allergy & Precautions, H&P , NPO status , Patient's Chart, lab work & pertinent test results  History of Anesthesia Complications (+) PONV  Airway Mallampati: I  TM Distance: >3 FB Neck ROM: full    Dental  (+) Teeth Intact   Pulmonary neg sleep apnea, neg COPD, Current Smoker and Patient abstained from smoking.,    breath sounds clear to auscultation       Cardiovascular (-) angina(-) Past MI and (-) Cardiac Stents negative cardio ROS  (-) dysrhythmias  Rhythm:regular Rate:Normal     Neuro/Psych  Headaches, PSYCHIATRIC DISORDERS Depression    GI/Hepatic Neg liver ROS, GERD  ,  Endo/Other  negative endocrine ROS  Renal/GU      Musculoskeletal   Abdominal   Peds  Hematology negative hematology ROS (+)   Anesthesia Other Findings 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)  Past Medical History: No date: Back pain No date: Cancer ([place])     Comment:  cervical No date: Cervical dysplasia No date: Clavicle fracture     Comment:  left No date: Elevated liver enzymes No date: Family history of BRCA2 gene positive No date: Family history of breast cancer No date: Family history of ovarian cancer No date: GERD (gastroesophageal reflux disease) No date: Headache No date: High cholesterol 01/26/2020: History of 2019 novel coronavirus disease (COVID-19) No date: Joint pain No date: Obesity (BMI 30.0-34.9) No date: Ovarian cyst No date: Pneumonia No date: PONV (postoperative nausea and vomiting) No date: Seizures ([place])     Comment:  as a child only No date: Vitamin D deficiency  Past Surgical History: No date: BUNIONECTOMY No date: DILATION AND CURETTAGE OF UTERUS No date: FRACTURE SURGERY No date: HARDWARE REMOVAL     Comment:  left clavicle 07/26/2020: JOINT REPLACEMENT 07/26/2020: KNEE ARTHROSCOPY  WITH MEDIAL MENISECTOMY; Left     Comment:  Procedure: LEFT KNEE ARTHROSCOPY WITH  PARTIAL MEDIAL               MENISECTOMY;  Surgeon: Thornton Park, MD;  Location:               [facility];  Service: Orthopedics;  Laterality: Left; No date: LEEP 08/21/2016: ORIF CLAVICULAR FRACTURE; Left     Comment:  Procedure: OPEN REDUCTION INTERNAL FIXATION (ORIF) LEFT               CLAVICLE FRACTURE;  Surgeon: Leandrew Koyanagi, MD;  Location:              [facility];  Service: Orthopedics;  Laterality: Left; No date: OVARY SURGERY     Comment:  cyst removed No date: WISDOM TOOTH EXTRACTION     Reproductive/Obstetrics negative OB ROS                           Anesthesia Physical Anesthesia Plan  ASA: II  Anesthesia Plan: General ETT   Post-op Pain Management: GA combined w/ Regional for post-op pain   Induction:   PONV Risk Score and Plan: Ondansetron, Dexamethasone, Midazolam and Scopolamine patch - Pre-op  Airway Management Planned:   Additional Equipment:   Intra-op Plan:   Post-operative Plan:   Informed Consent: I have reviewed the patients History and Physical, chart, labs and discussed the procedure including the risks, benefits and alternatives for the proposed anesthesia with the patient or authorized representative who has indicated  his/her understanding and acceptance.     Dental Advisory Given  Plan Discussed with: Anesthesiologist, CRNA and Surgeon  Anesthesia Plan Comments: ("Allergy" to articaine does not sound likely to be a true allergy - this medication was given at her dentist's office at the same time as clindamycin so both were listed on her allergy list.  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  She has tolerated other amides previously - lidocaine during at least two prior anesthetics.  Discussed that it is likely safe to proceed with block.)     Anesthesia Quick Evaluation

## 2020-09-11 NOTE — Anesthesia Postprocedure Evaluation (Signed)
Anesthesia Post Note  Patient: Rebecca Adkins  Procedure(s) Performed: LEFT SHOULDER ARTHROSCOPY WITH LABRAL REPAIR (Left Shoulder)  Patient location during evaluation: PACU Anesthesia Type: General and Regional Level of consciousness: awake and alert Pain management: pain level controlled Vital Signs Assessment: post-procedure vital signs reviewed and stable Respiratory status: spontaneous breathing, nonlabored ventilation, respiratory function stable and patient connected to nasal cannula oxygen Cardiovascular status: blood pressure returned to baseline and stable Postop Assessment: no apparent nausea or vomiting Anesthetic complications: no   No complications documented.   Last Vitals:  Vitals:   09/11/20 1555 09/11/20 1623  BP: 103/87 109/71  Pulse: 70 72  Resp: 18   Temp: (!) 36.3 C   SpO2: 99% 96%    Last Pain:  Vitals:   09/11/20 1623  TempSrc:   PainSc: 0-No pain                 Martha Clan

## 2020-09-11 NOTE — Transfer of Care (Signed)
Immediate Anesthesia Transfer of Care Note  Patient: Rebecca Adkins  Procedure(s) Performed: LEFT SHOULDER ARTHROSCOPY WITH LABRAL REPAIR (Left Shoulder)  Patient Location: PACU  Anesthesia Type:General  Level of Consciousness: drowsy  Airway & Oxygen Therapy: Patient Spontanous Breathing and Patient connected to face mask oxygen  Post-op Assessment: Report given to RN  Post vital signs: stable  Last Vitals:  Vitals Value Taken Time  BP    Temp    Pulse 116 09/11/20 1515  Resp 17 09/11/20 1515  SpO2 94 % 09/11/20 1515  Vitals shown include unvalidated device data.  Last Pain:  Vitals:   09/11/20 1031  TempSrc: Tympanic  PainSc: 2          Complications: No complications documented.

## 2020-09-12 ENCOUNTER — Encounter: Payer: Self-pay | Admitting: Orthopedic Surgery

## 2021-05-08 ENCOUNTER — Other Ambulatory Visit (HOSPITAL_COMMUNITY): Payer: Self-pay | Admitting: Gastroenterology

## 2021-05-08 ENCOUNTER — Other Ambulatory Visit: Payer: Self-pay | Admitting: Gastroenterology

## 2021-05-08 DIAGNOSIS — R1011 Right upper quadrant pain: Secondary | ICD-10-CM

## 2021-05-21 ENCOUNTER — Ambulatory Visit (HOSPITAL_COMMUNITY)
Admission: RE | Admit: 2021-05-21 | Discharge: 2021-05-21 | Disposition: A | Discharge status: Home / Self Care | Payer: 59 | Source: Ambulatory Visit | Attending: Gastroenterology | Admitting: Gastroenterology

## 2021-05-21 ENCOUNTER — Other Ambulatory Visit: Payer: Self-pay

## 2021-05-21 DIAGNOSIS — R1011 Right upper quadrant pain: Secondary | ICD-10-CM | POA: Insufficient documentation

## 2021-05-21 MED ORDER — TECHNETIUM TC 99M MEBROFENIN IV KIT
5.3000 | PACK | Freq: Once | INTRAVENOUS | Status: AC | PRN
  Administered 2021-05-21: 5.3 via INTRAVENOUS

## 2021-10-31 ENCOUNTER — Other Ambulatory Visit: Payer: Self-pay | Admitting: Gastroenterology

## 2021-10-31 DIAGNOSIS — R1033 Periumbilical pain: Secondary | ICD-10-CM

## 2021-10-31 DIAGNOSIS — R1011 Right upper quadrant pain: Secondary | ICD-10-CM

## 2021-10-31 DIAGNOSIS — R112 Nausea with vomiting, unspecified: Secondary | ICD-10-CM

## 2021-11-07 ENCOUNTER — Ambulatory Visit
Admission: RE | Admit: 2021-11-07 | Discharge: 2021-11-07 | Disposition: A | Discharge status: Home / Self Care | Payer: PRIVATE HEALTH INSURANCE | Source: Ambulatory Visit | Attending: Gastroenterology | Admitting: Gastroenterology

## 2021-11-07 DIAGNOSIS — R1033 Periumbilical pain: Secondary | ICD-10-CM

## 2021-11-07 DIAGNOSIS — R112 Nausea with vomiting, unspecified: Secondary | ICD-10-CM

## 2021-11-07 DIAGNOSIS — R1011 Right upper quadrant pain: Secondary | ICD-10-CM

## 2021-11-07 MED ORDER — IOPAMIDOL (ISOVUE-300) INJECTION 61%
100.0000 mL | Freq: Once | INTRAVENOUS | Status: AC | PRN
  Administered 2021-11-07: 100 mL via INTRAVENOUS

## 2022-04-20 LAB — HM MAMMOGRAPHY

## 2022-04-30 ENCOUNTER — Encounter (INDEPENDENT_AMBULATORY_CARE_PROVIDER_SITE_OTHER): Payer: Self-pay

## 2023-05-08 LAB — HM PAP SMEAR

## 2023-09-25 DIAGNOSIS — M542 Cervicalgia: Secondary | ICD-10-CM | POA: Diagnosis not present

## 2023-09-25 DIAGNOSIS — M25511 Pain in right shoulder: Secondary | ICD-10-CM | POA: Diagnosis not present

## 2023-10-08 DIAGNOSIS — S46811D Strain of other muscles, fascia and tendons at shoulder and upper arm level, right arm, subsequent encounter: Secondary | ICD-10-CM | POA: Diagnosis not present

## 2023-11-09 DIAGNOSIS — S46811D Strain of other muscles, fascia and tendons at shoulder and upper arm level, right arm, subsequent encounter: Secondary | ICD-10-CM | POA: Diagnosis not present

## 2024-03-10 ENCOUNTER — Ambulatory Visit

## 2024-03-10 VITALS — BP 106/72 | HR 86 | Ht 68.0 in | Wt 185.0 lb

## 2024-03-10 DIAGNOSIS — Z6833 Body mass index (BMI) 33.0-33.9, adult: Secondary | ICD-10-CM | POA: Diagnosis not present

## 2024-03-10 DIAGNOSIS — E88819 Insulin resistance, unspecified: Secondary | ICD-10-CM | POA: Diagnosis not present

## 2024-03-10 DIAGNOSIS — G8929 Other chronic pain: Secondary | ICD-10-CM | POA: Insufficient documentation

## 2024-03-10 DIAGNOSIS — F3289 Other specified depressive episodes: Secondary | ICD-10-CM | POA: Diagnosis not present

## 2024-03-10 DIAGNOSIS — E559 Vitamin D deficiency, unspecified: Secondary | ICD-10-CM

## 2024-03-10 DIAGNOSIS — Z130 Encounter for screening for diseases of the blood and blood-forming organs and certain disorders involving the immune mechanism: Secondary | ICD-10-CM

## 2024-03-10 DIAGNOSIS — Z0000 Encounter for general adult medical examination without abnormal findings: Secondary | ICD-10-CM | POA: Diagnosis not present

## 2024-03-10 DIAGNOSIS — Z1159 Encounter for screening for other viral diseases: Secondary | ICD-10-CM

## 2024-03-10 DIAGNOSIS — R1011 Right upper quadrant pain: Secondary | ICD-10-CM | POA: Diagnosis not present

## 2024-03-10 MED ORDER — SERTRALINE HCL 50 MG PO TABS: 50.0000 mg | ORAL_TABLET | Freq: Every day | ORAL | 1 refills | Status: DC

## 2024-03-10 NOTE — Assessment & Plan Note (Signed)
 Labs today.  See orders.  Advised patient to continue eating a well-balanced diet and incorporating weekly exercise to maintain a healthy lifestyle.  Discussed vaccines and age-appropriate health screenings today.  Will plan for yearly physicals with medication follow-ups every 3 to 6 months.

## 2024-03-10 NOTE — Progress Notes (Signed)
 New Patient Office Visit  Subjective    Patient ID: Rebecca Adkins, female    DOB: 07-Jan-1991  Age: 33 y.o. MRN: [id number]  CC:  Chief Complaint  Patient presents with   New Patient (Initial Visit)    Establishing Care    HPI Rebecca Adkins presents to establish care.   Patient has 2 main concerns she would like to discuss today.  PMDD: Patient reports a diagnosed history of PMDD which she previously took Zoloft 25 mg for.  She reports she took this medication for about a year however did not notice significant improvement in her mood.  She reports that she has taken many different forms of birth control, however did not like the side effects caused by any of them.  Also reports that her periods are very heavy lasting 5 to 6 days with very painful cramping.  Right upper quadrant pain: Patient also reports a 3-year history of dull, aching right upper quadrant pain.  This pain is intermittent.  Denies any associated nausea or vomiting.  Denies changes in stools, however does endorse intermittent constipation.  Patient reports that occasionally the pain will relieve when she straightens the posture of her spine.  Otherwise no identifiable triggers for this pain.  Has not paid attention to if this pain is associated with her periods.  Patient had a right upper quadrant ultrasound and a HIDA scan both in 2022 which were unremarkable.  She reports that this pain is not necessarily that bothersome to her, however she would like to understand more about what is causing it.  PMH: Significant for GERD, PMDD, insulin  resistance, cervical cancer treated with LEEP  PSH: LEEP procedure, bunionectomy, left medial meniscal repair, ORIF clavicular fracture 2017, shoulder arthroscopy with labral repair 2021.  FH: Mother: Hypertension, ovarian cancer, obesity Sister: Breast cancer, BRCA2 positive, hypertension Maternal grandmother: Hypertension, diabetes, kidney disease Maternal grandfather:  Stroke Paternal grandmother: COPD Paternal aunt: Colon cancer   Tobacco use: Vapes daily Alcohol use: Socially Drug use: Endorses intermittent marijuana use Marital status: Married Employment: Advertising account executive, HR Sexual hx: Sexually active  Screenings:  Colon Cancer: Not applicable Lung Cancer: Not applicable Breast Cancer: Patient reports due to having positive BRCA gene and a direct relative (sister) with breast cancer, she had a mammogram 3 to 4 years ago which was normal.  She believes that her next one is coming up due.  Diabetes: Checking A1c with labs today HLD: Checking lipid panel with labs today   Outpatient Encounter Medications as of 03/10/2024  Medication Sig   sertraline (ZOLOFT) 50 MG tablet Take 1 tablet (50 mg total) by mouth daily.   acetaminophen  (TYLENOL ) 500 MG tablet Take 1,000 mg by mouth every 6 (six) hours as needed for mild pain or moderate pain.   famotidine  (PEPCID ) 10 MG tablet Take 10 mg by mouth 2 (two) times daily as needed for heartburn.    loratadine (CLARITIN) 10 MG tablet Take 10 mg by mouth daily as needed for allergies.    metFORMIN (GLUCOPHAGE) 500 MG tablet Take 500 mg by mouth in the morning and at bedtime.   Naphazoline-Pheniramine (VISINE-A OP) Place 1 drop into both eyes daily as needed (allergies).   ondansetron  (ZOFRAN ) 4 MG tablet Take 1 tablet (4 mg total) by mouth every 8 (eight) hours as needed for nausea or vomiting.   oxyCODONE  (OXY IR/ROXICODONE ) 5 MG immediate release tablet Take 1 tablet (5 mg total) by mouth every 4 (four) hours as needed.   Semaglutide,  1 MG/DOSE, (OZEMPIC, 1 MG/DOSE,) 2 MG/1.5ML SOPN Inject 1 mg into the skin every Monday.   No facility-administered encounter medications on file as of 03/10/2024.    Past Medical History:  Diagnosis Date   Back pain    Cancer (HCC)    cervical   Cervical dysplasia    Clavicle fracture    left   Elevated liver enzymes    Family history of BRCA2 gene positive     Family history of breast cancer    Family history of ovarian cancer    GERD (gastroesophageal reflux disease)    Headache    High cholesterol    History of 2019 novel coronavirus disease (COVID-19) 01/26/2020   Joint pain    Obesity (BMI 30.0-34.9)    Ovarian cyst    Pneumonia    PONV (postoperative nausea and vomiting)    Seizures (HCC)    as a child only   Vitamin D deficiency     Past Surgical History:  Procedure Laterality Date   BUNIONECTOMY     DILATION AND CURETTAGE OF UTERUS     FRACTURE SURGERY     HARDWARE REMOVAL     left clavicle   JOINT REPLACEMENT  07/26/2020   KNEE ARTHROSCOPY WITH MEDIAL MENISECTOMY Left 07/26/2020   Procedure: LEFT KNEE ARTHROSCOPY WITH  PARTIAL MEDIAL MENISECTOMY;  Surgeon: Rande Bushy, MD;  Location: [facility] ORS;  Service: Orthopedics;  Laterality: Left;   LEEP     ORIF CLAVICULAR FRACTURE Left 08/21/2016   Procedure: OPEN REDUCTION INTERNAL FIXATION (ORIF) LEFT CLAVICLE FRACTURE;  Surgeon: Wes Hamman, MD;  Location: [facility] OR;  Service: Orthopedics;  Laterality: Left;   OVARY SURGERY     cyst removed   SHOULDER ARTHROSCOPY WITH LABRAL REPAIR Left 09/11/2020   Procedure: LEFT SHOULDER ARTHROSCOPY WITH LABRAL REPAIR;  Surgeon: Rande Bushy, MD;  Location: [facility] ORS;  Service: Orthopedics;  Laterality: Left;   WISDOM TOOTH EXTRACTION      Family History  Problem Relation Age of Onset   Hypertension Mother    Ovarian cancer Mother 33   Obesity Mother    Diabetes Other    Hypertension Other    COPD Other    Other Other    Breast cancer Sister 55   BRCA 1/2 Sister        BRCA2 pos   Hypertension Sister    Colon cancer Paternal Aunt        dx over 60   Hypertension Maternal Grandmother    Diabetes Maternal Grandmother    Kidney disease Maternal Grandmother    Stroke Maternal Grandfather    COPD Paternal Grandmother    Other Paternal Uncle        infant death   Breast cancer Other        PGMs sister   Breast cancer Other         PGMs sister    Social History   Socioeconomic History   Marital status: Significant Other    Spouse name: brian   Number of children: Not on file   Years of education: Not on file   Highest education level: Associate degree: academic program  Occupational History   Occupation: work from home - [employer]/office  Tobacco Use   Smoking status: Some Days    Current packs/day: 0.25    Average packs/day: 0.3 packs/day for 13.0 years (3.3 ttl pk-yrs)    Types: Cigarettes   Smokeless tobacco: Never  Vaping Use   Vaping  status: Every Day   Substances: Nicotine  Substance and Sexual Activity   Alcohol use: Yes    Comment: occassional   Drug use: Not Currently    Comment: cbc gummies   Sexual activity: Yes    Birth control/protection: Pill  Other Topics Concern   Not on file  Social History Narrative   Not on file   Social Drivers of Health   Financial Resource Strain: Low Risk  (03/10/2024)   Overall Financial Resource Strain (CARDIA)    Difficulty of Paying Living Expenses: Not hard at all  Food Insecurity: No Food Insecurity (03/10/2024)   Hunger Vital Sign    Worried About Running Out of Food in the Last Year: Never true    Ran Out of Food in the Last Year: Never true  Transportation Needs: No Transportation Needs (03/10/2024)   PRAPARE - Administrator, Civil Service (Medical): No    Lack of Transportation (Non-Medical): No  Physical Activity: Insufficiently Active (03/10/2024)   Exercise Vital Sign    Days of Exercise per Week: 3 days    Minutes of Exercise per Session: 40 min  Stress: Stress Concern Present (03/10/2024)   [employer] of Occupational Health - Occupational Stress Questionnaire    Feeling of Stress: To some extent  Social Connections: Moderately Integrated (03/10/2024)   Social Connection and Isolation Panel    Frequency of Communication with Friends and Family: More than three times a week    Frequency of Social Gatherings with  Friends and Family: Once a week    Attends Religious Services: 1 to 4 times per year    Active Member of [employer] or Organizations: No    Attends Engineer, structural: Not on file    Marital Status: Living with partner  Intimate Partner Violence: Unknown (12/27/2021)   Received from [facility]   HITS    Physically Hurt: Not on file    Insult or Talk Down To: Not on file    Threaten Physical Harm: Not on file    Scream or Curse: Not on file    ROS   As noted in HPI.   Objective    BP 106/72   Pulse 86   Ht 5' 8 (1.727 m)   Wt 185 lb 0.6 oz (83.9 kg)   LMP 03/09/2024   SpO2 98%   BMI 28.14 kg/m   Physical Exam Constitutional:      General: She is not in acute distress.    Appearance: Normal appearance.  HENT:     Right Ear: Tympanic membrane normal.     Left Ear: Tympanic membrane normal.     Mouth/Throat:     Mouth: Mucous membranes are moist.     Pharynx: Oropharynx is clear.   Eyes:     Pupils: Pupils are equal, round, and reactive to light.    Cardiovascular:     Rate and Rhythm: Normal rate and regular rhythm.     Heart sounds: Normal heart sounds. No murmur heard.    No friction rub. No gallop.  Pulmonary:     Effort: Pulmonary effort is normal. No respiratory distress.     Breath sounds: Normal breath sounds.  Abdominal:     General: Abdomen is flat. There is no distension.     Palpations: Abdomen is soft.     Tenderness: There is no abdominal tenderness. There is no guarding or rebound.   Musculoskeletal:        General: No  swelling.     Cervical back: Neck supple. No tenderness.   Skin:    General: Skin is warm and dry.   Neurological:     General: No focal deficit present.     Mental Status: She is alert.   Psychiatric:        Mood and Affect: Mood normal.        Behavior: Behavior normal.        Thought Content: Thought content normal.        Assessment & Plan:   BMI 33.0-33.9,adult Assessment & Plan: Continue Wegovy 1  mg as prescribed through [facility] online program.   Orders: -     CBC with Differential/Platelet -     Comprehensive metabolic panel with GFR -     Lipid panel -     Hemoglobin A1c -     TSH  Vitamin D deficiency -     VITAMIN D 25 Hydroxy (Vit-D Deficiency, Fractures)  Insulin  resistance -     Hemoglobin A1c  Screening for deficiency anemia -     CBC with Differential/Platelet  Screening for viral disease -     Hepatitis C antibody -     HIV Antibody (routine testing w rflx)  Healthcare maintenance Assessment & Plan: Labs today.  See orders.  Advised patient to continue eating a well-balanced diet and incorporating weekly exercise to maintain a healthy lifestyle.  Discussed vaccines and age-appropriate health screenings today.  Will plan for yearly physicals with medication follow-ups every 3 to 6 months.   Other depression Assessment & Plan: Start Zoloft 25 mg x 2 weeks.  After 2 weeks instructed patient to increase to 50 mg thereafter for depression and PMDD.  Advised patient that this medication will take 4 to 6 weeks to reach therapeutic effects.  Will follow-up with her in person in 3 months to assess mood.   Chronic RUQ pain Assessment & Plan: Ultrasound of RUQ and HIDA scan unremarkable as noted by workup from GI.  Given that patient reports other symptoms such as heavy, painful periods, constipation, history of ovarian cyst, asked patient to consider thinking about transvaginal ultrasound to start the workup for endometriosis as a cause of her right upper quadrant pain.  Will follow-up with patient at her next visit in 3 months   Other orders -     Sertraline HCl; Take 1 tablet (50 mg total) by mouth daily.  Dispense: 90 tablet; Refill: 1    Return in about 3 months (around 06/10/2024) for Mood, abdominal pain.   Odilia Bennett, PA-C

## 2024-03-10 NOTE — Assessment & Plan Note (Signed)
 Ultrasound of RUQ and HIDA scan unremarkable as noted by workup from GI.  Given that patient reports other symptoms such as heavy, painful periods, constipation, history of ovarian cyst, asked patient to consider thinking about transvaginal ultrasound to start the workup for endometriosis as a cause of her right upper quadrant pain.  Will follow-up with patient at her next visit in 3 months

## 2024-03-10 NOTE — Patient Instructions (Signed)
 It was nice to see you today!  As we discussed in clinic:  -I have sent in the 50 mg tablets of Zoloft for mood/PMDD. Please take 0.5 tablet (25 mg) x 2 weeks, then increase to 1 whole tablet (50 mg) thereafter. I will plan to follow up in person with you on this in about 3 months!  -Continue all of your other medications as prescribed.  -We got some bloodwork on you today, so I will send you a MyChart message in 24-28 hours explaining the results!  -In the meantime, be thinking about if you would like to pursue a transvaginal ultrasound to investigate further for endometriosis.  -It was so nice to meet you!   If you have any problems before your next visit feel free to message me via MyChart (minor issues or questions) or call the office, otherwise you may reach out to schedule an office visit.  Thank you! Meryl Acosta, PA-C

## 2024-03-10 NOTE — Assessment & Plan Note (Addendum)
 Start Zoloft 25 mg x 2 weeks.  After 2 weeks instructed patient to increase to 50 mg thereafter for depression and PMDD.  Advised patient that this medication will take 4 to 6 weeks to reach therapeutic effects.  Will follow-up with her in person in 3 months to assess mood.

## 2024-03-10 NOTE — Assessment & Plan Note (Signed)
 Continue Wegovy 1 mg as prescribed through [employer] program.

## 2024-03-11 ENCOUNTER — Ambulatory Visit: Payer: Self-pay

## 2024-03-11 LAB — CBC WITH DIFFERENTIAL/PLATELET
Basophils Absolute: 0.1 10*3/uL (ref 0.0–0.2)
Basos: 1 %
EOS (ABSOLUTE): 0.2 10*3/uL (ref 0.0–0.4)
Eos: 3 %
Hematocrit: 44.1 % (ref 34.0–46.6)
Hemoglobin: 14.4 g/dL (ref 11.1–15.9)
Immature Grans (Abs): 0 10*3/uL (ref 0.0–0.1)
Immature Granulocytes: 0 %
Lymphocytes Absolute: 2 10*3/uL (ref 0.7–3.1)
Lymphs: 25 %
MCH: 31.4 pg (ref 26.6–33.0)
MCHC: 32.7 g/dL (ref 31.5–35.7)
MCV: 96 fL (ref 79–97)
Monocytes Absolute: 0.4 10*3/uL (ref 0.1–0.9)
Monocytes: 6 %
Neutrophils Absolute: 5.1 10*3/uL (ref 1.4–7.0)
Neutrophils: 65 %
Platelets: 350 10*3/uL (ref 150–450)
RBC: 4.59 x10E6/uL (ref 3.77–5.28)
RDW: 12.4 % (ref 11.7–15.4)
WBC: 7.8 10*3/uL (ref 3.4–10.8)

## 2024-03-11 LAB — COMPREHENSIVE METABOLIC PANEL WITH GFR
ALT: 21 IU/L (ref 0–32)
AST: 17 IU/L (ref 0–40)
Albumin: 4.5 g/dL (ref 3.9–4.9)
Alkaline Phosphatase: 44 IU/L (ref 44–121)
BUN/Creatinine Ratio: 13 (ref 9–23)
BUN: 10 mg/dL (ref 6–20)
Bilirubin Total: 0.5 mg/dL (ref 0.0–1.2)
CO2: 23 mmol/L (ref 20–29)
Calcium: 9.7 mg/dL (ref 8.7–10.2)
Chloride: 101 mmol/L (ref 96–106)
Creatinine, Ser: 0.78 mg/dL (ref 0.57–1.00)
Globulin, Total: 2.5 g/dL (ref 1.5–4.5)
Glucose: 88 mg/dL (ref 70–99)
Potassium: 4.8 mmol/L (ref 3.5–5.2)
Sodium: 138 mmol/L (ref 134–144)
Total Protein: 7 g/dL (ref 6.0–8.5)
eGFR: 102 mL/min/{1.73_m2} (ref >59)

## 2024-03-11 LAB — LIPID PANEL
Chol/HDL Ratio: 2.8 ratio (ref 0.0–4.4)
Cholesterol, Total: 205 mg/dL — ABNORMAL HIGH (ref 100–199)
HDL: 72 mg/dL (ref >39)
LDL Chol Calc (NIH): 122 mg/dL — ABNORMAL HIGH (ref 0–99)
Triglycerides: 61 mg/dL (ref 0–149)
VLDL Cholesterol Cal: 11 mg/dL (ref 5–40)

## 2024-03-11 LAB — HIV ANTIBODY (ROUTINE TESTING W REFLEX): HIV Screen 4th Generation wRfx: NONREACTIVE

## 2024-03-11 LAB — VITAMIN D 25 HYDROXY (VIT D DEFICIENCY, FRACTURES): Vit D, 25-Hydroxy: 38.2 ng/mL (ref 30.0–100.0)

## 2024-03-11 LAB — HEMOGLOBIN A1C
Est. average glucose Bld gHb Est-mCnc: 105 mg/dL
Hgb A1c MFr Bld: 5.3 % (ref 4.8–5.6)

## 2024-03-11 LAB — TSH: TSH: 1.72 u[IU]/mL (ref 0.450–4.500)

## 2024-03-11 LAB — HEPATITIS C ANTIBODY: Hep C Virus Ab: NONREACTIVE

## 2024-06-07 ENCOUNTER — Ambulatory Visit

## 2024-06-07 VITALS — BP 113/77 | HR 71 | Temp 97.9°F | Ht 68.0 in | Wt 178.1 lb

## 2024-06-07 DIAGNOSIS — F3289 Other specified depressive episodes: Secondary | ICD-10-CM

## 2024-06-07 DIAGNOSIS — G8929 Other chronic pain: Secondary | ICD-10-CM | POA: Diagnosis not present

## 2024-06-07 DIAGNOSIS — R41840 Attention and concentration deficit: Secondary | ICD-10-CM | POA: Insufficient documentation

## 2024-06-07 DIAGNOSIS — R1011 Right upper quadrant pain: Secondary | ICD-10-CM | POA: Diagnosis not present

## 2024-06-07 DIAGNOSIS — B354 Tinea corporis: Secondary | ICD-10-CM | POA: Diagnosis not present

## 2024-06-07 MED ORDER — SERTRALINE HCL 50 MG PO TABS: 50.0000 mg | ORAL_TABLET | Freq: Every day | ORAL | 1 refills | Status: AC

## 2024-06-07 MED ORDER — KETOCONAZOLE 2 % EX CREA: 1.0000 | TOPICAL_CREAM | Freq: Two times a day (BID) | CUTANEOUS | 0 refills | Status: AC

## 2024-06-07 NOTE — Assessment & Plan Note (Signed)
 Rash consistent with tinea corporis, likely from recent travel or animal contact. - Prescribe ketoconazole  cream for topical treatment. - Advise application twice daily for 10-14 days. - Instruct to change bed sheets after initial application and after one week.

## 2024-06-07 NOTE — Assessment & Plan Note (Signed)
 Symptoms suggest ADHD based on counseling evaluation, unrecognized previously. - Refer to [facility] for formal ADHD evaluation. - Provide contact information for follow-up if not contacted within a week.

## 2024-06-07 NOTE — Patient Instructions (Addendum)
 VISIT SUMMARY: During your visit, we discussed your persistent right upper quadrant abdominal pain, appetite and weight management, recent changes in bowel habits, a new skin lesion, premenstrual symptoms, and psychiatric history. We reviewed your current medications and previous diagnostic work-ups.  YOUR PLAN: -CHRONIC RIGHT UPPER QUADRANT ABDOMINAL PAIN: This pain has been persistent for three years and is suspected to be related to your gallbladder, even though previous imaging did not show any issues. We will order another abdominal ultrasound to reassess your gallbladder and refer you to a gastrointestinal specialist for further evaluation. The GI office phone number is Phone: [phone]  -SUSPECTED ATTENTION-DEFICIT HYPERACTIVITY DISORDER (ADHD): Based on your counseling evaluation, you may have ADHD, which has not been recognized before. We will refer you to a specialist for a formal evaluation and provide you with their contact information. [facility] - Address: [address] [place], [place], [place] [address] Phone: [phone]  -MAJOR DEPRESSIVE DISORDER, SINGLE EPISODE, UNSPECIFIED: You are currently managing your depression with sertraline , which has also helped with your premenstrual symptoms. Continue taking sertraline  50 mg daily, and we will provide refills at [facility]. We will monitor your symptoms and adjust the dosage if necessary.  -TINEA CORPORIS (RINGWORM): This is a fungal infection of the skin, likely contracted during recent travel or from pets. We will prescribe ketoconazole  cream for you to apply twice daily for 10-14 days. Please change your bed sheets after the first application and again after one week.  INSTRUCTIONS: Please follow up with the gastrointestinal specialist at [facility] for further evaluation of your abdominal pain. Additionally, contact [facility] for a formal ADHD evaluation if you do not hear from them within a  week. Continue taking your sertraline  as prescribed and monitor your symptoms. Apply the ketoconazole  cream as directed for your skin lesion and change your bed sheets as advised.  If you have any problems before your next visit feel free to message me via MyChart (minor issues or questions) or call the office, otherwise you may reach out to schedule an office visit.  Thank you! Saddie Sacks, PA-C

## 2024-06-07 NOTE — Assessment & Plan Note (Signed)
 Managed on sertraline  50 mg daily with improvement in PMS symptoms and no significant side effects. Counseling beneficial. - Continue sertraline  50 mg daily. - Provide refills at [facility]. - Monitor symptoms and adjust dosage if necessary.

## 2024-06-07 NOTE — Assessment & Plan Note (Signed)
 Persistent pain for three years with gallbladder involvement suspected despite negative imaging. Symptoms triggered by fatty foods and alcohol. Endometriosis unlikely at this point.  - Order abdominal ultrasound to reassess gallbladder and update imaging (last u/s from 2022) - Refer to [facility] for further evaluation.

## 2024-06-07 NOTE — Progress Notes (Signed)
 Established Patient Office Visit  Subjective   Patient ID: Rebecca Adkins, female    DOB: 1991/04/26  Age: 33 y.o. MRN: [id number]  Chief Complaint  Patient presents with   Medical Management of Chronic Issues    3 month F/U    HPI  History of Present Illness   Rebecca Adkins is a 34 year old female who presents with persistent right upper quadrant abdominal pain.  Right upper quadrant abdominal pain - Persistent for approximately three years - Exacerbated by consumption of fatty foods and alcohol - Significant increases in pain during two vacations with alcohol intake - Previous diagnostic work-up includes ultrasound and HIDA scan without evidence of gallstones - CT scan in 2023 showed no abnormalities - No improvement in symptoms since discontinuing Wegovy - Last evaluation by GI specialist was three years ago at [facility] who told her it was costochondritis   Appetite and weight management - Previously on Wegovy for weight management, reached goal weight and tapered off by April 05, 2024 - Decreased appetite since discontinuing Wegovy - No resumption of Wegovy - No improvement in abdominal symptoms after stopping medication  Cutaneous lesion - Developed a skin lesion resembling ringworm approximately two weeks ago - Lesion is non-pruritic - No other similar lesions present - Possible exposure during recent travel or from pets  Premenstrual symptoms and psychiatric history - Currently taking sertraline  (Zoloft ), titrated to 50 mg - started 3 months ago for PMS symptoms - Tolerates sertraline  well with no significant side effects - Improvement in premenstrual symptoms on sertraline  - Attending counseling since April 2025 - Evaluated for ADHD by counselor, further assessment suggested by counselor and patient would like to know next steps for formal ADHD evaluation  Gastroesophageal symptoms - No burning in the chest or reflux symptoms since discontinuing Wegovy           ROS Per HPI.    Objective:     BP 113/77   Pulse 71   Temp 97.9 F (36.6 C) (Oral)   Ht 5' 8 (1.727 m)   Wt 178 lb 1.3 oz (80.8 kg)   LMP 05/23/2024   SpO2 99%   BMI 27.08 kg/m    Physical Exam Constitutional:      General: She is not in acute distress.    Appearance: Normal appearance.  Cardiovascular:     Rate and Rhythm: Normal rate and regular rhythm.     Heart sounds: Normal heart sounds. No murmur heard.    No friction rub. No gallop.  Pulmonary:     Effort: Pulmonary effort is normal. No respiratory distress.     Breath sounds: Normal breath sounds.  Abdominal:     General: Abdomen is flat. Bowel sounds are normal.     Palpations: Abdomen is soft.     Comments: No RUQ TTP on exam today  Musculoskeletal:        General: No swelling.  Skin:    General: Skin is warm and dry.     Findings: Rash (Approximately quarter-sized, raised, red/flaking, circular rash on the back of the left upper arm) present.  Neurological:     General: No focal deficit present.     Mental Status: She is alert.  Psychiatric:        Mood and Affect: Mood normal.        Behavior: Behavior normal.        Thought Content: Thought content normal.     No results found for any visits  on 06/07/24.    The ASCVD Risk score (Arnett DK, et al., 2019) failed to calculate for the following reasons:   The 2019 ASCVD risk score is only valid for ages 69 to 65    Assessment & Plan:   Chronic RUQ pain Assessment & Plan: Persistent pain for three years with gallbladder involvement suspected despite negative imaging. Symptoms triggered by fatty foods and alcohol. Endometriosis unlikely at this point.  - Order abdominal ultrasound to reassess gallbladder and update imaging (last u/s from 2022) - Refer to [facility] for further evaluation.  Orders: -     US  ABDOMEN LIMITED RUQ (LIVER/GB); Future -     Ambulatory referral to Gastroenterology  Other depression Assessment &  Plan: Managed on sertraline  50 mg daily with improvement in PMS symptoms and no significant side effects. Counseling beneficial. - Continue sertraline  50 mg daily. - Provide refills at [facility]. - Monitor symptoms and adjust dosage if necessary.  Orders: -     Sertraline  HCl; Take 1 tablet (50 mg total) by mouth daily.  Dispense: 90 tablet; Refill: 1  Tinea corporis Assessment & Plan: Rash consistent with tinea corporis, likely from recent travel or animal contact. - Prescribe ketoconazole  cream for topical treatment. - Advise application twice daily for 10-14 days. - Instruct to change bed sheets after initial application and after one week.   Orders: -     Ketoconazole ; Apply 1 Application topically 2 (two) times daily.  Dispense: 30 g; Refill: 0  Attention deficit Assessment & Plan: Symptoms suggest ADHD based on counseling evaluation, unrecognized previously. - Refer to [facility] for formal ADHD evaluation. - Provide contact information for follow-up if not contacted within a week.   Orders: -     Ambulatory referral to Psychiatry   Return in about 5 months (around 11/07/2024) for Mood, RUQ pain, ?ADHD.    Saddie JULIANNA Sacks, PA-C

## 2024-06-08 ENCOUNTER — Ambulatory Visit
Admission: RE | Admit: 2024-06-08 | Discharge: 2024-06-08 | Disposition: A | Discharge status: Home / Self Care | Source: Ambulatory Visit

## 2024-06-08 DIAGNOSIS — G8929 Other chronic pain: Secondary | ICD-10-CM

## 2024-06-08 DIAGNOSIS — R1011 Right upper quadrant pain: Secondary | ICD-10-CM | POA: Diagnosis not present

## 2024-06-12 ENCOUNTER — Ambulatory Visit: Payer: Self-pay

## 2024-06-16 ENCOUNTER — Ambulatory Visit

## 2024-10-25 ENCOUNTER — Ambulatory Visit
Admission: EM | Admit: 2024-10-25 | Discharge: 2024-10-25 | Disposition: A | Discharge status: Home / Self Care | Source: Home / Self Care | Attending: Family Medicine | Admitting: Family Medicine

## 2024-10-25 ENCOUNTER — Encounter: Payer: Self-pay | Admitting: Emergency Medicine

## 2024-10-25 DIAGNOSIS — N938 Other specified abnormal uterine and vaginal bleeding: Secondary | ICD-10-CM

## 2024-10-25 DIAGNOSIS — N939 Abnormal uterine and vaginal bleeding, unspecified: Secondary | ICD-10-CM

## 2024-10-25 DIAGNOSIS — N921 Excessive and frequent menstruation with irregular cycle: Secondary | ICD-10-CM

## 2024-10-25 LAB — POCT URINE PREGNANCY: Preg Test, Ur: NEGATIVE

## 2024-10-26 ENCOUNTER — Other Ambulatory Visit: Payer: Self-pay

## 2024-10-26 ENCOUNTER — Telehealth (HOSPITAL_COMMUNITY): Payer: Self-pay

## 2024-10-26 ENCOUNTER — Ambulatory Visit (HOSPITAL_COMMUNITY): Payer: Self-pay

## 2024-10-26 ENCOUNTER — Other Ambulatory Visit

## 2024-10-26 DIAGNOSIS — N921 Excessive and frequent menstruation with irregular cycle: Secondary | ICD-10-CM

## 2024-10-26 LAB — CERVICOVAGINAL ANCILLARY ONLY
Bacterial Vaginitis (gardnerella): POSITIVE — AB
Candida Glabrata: NEGATIVE
Candida Vaginitis: NEGATIVE
Chlamydia: NEGATIVE
Comment: NEGATIVE
Comment: NEGATIVE
Comment: NEGATIVE
Comment: NEGATIVE
Comment: NEGATIVE
Comment: NORMAL
Neisseria Gonorrhea: NEGATIVE
Trichomonas: NEGATIVE

## 2024-10-26 MED ORDER — METRONIDAZOLE 500 MG PO TABS: 500.0000 mg | ORAL_TABLET | Freq: Two times a day (BID) | ORAL | 0 refills | Status: AC

## 2024-10-26 MED ORDER — METRONIDAZOLE 500 MG PO TABS: 500.0000 mg | ORAL_TABLET | Freq: Two times a day (BID) | ORAL | 0 refills | Status: DC

## 2024-10-26 NOTE — Addendum Note (Signed)
 Addended by: ZIMMERMAN RUMPLE, Heyli Min D on: 10/26/2024 08:33 AM   Modules accepted: Orders

## 2024-10-26 NOTE — Telephone Encounter (Signed)
Pharmacy change per pt request

## 2024-10-26 NOTE — Telephone Encounter (Signed)
Pt coming in today for labs

## 2024-10-27 LAB — PROTIME-INR
INR: 1 (ref 0.9–1.2)
Prothrombin Time: 10.4 s (ref 9.1–12.0)

## 2024-10-27 LAB — APTT: aPTT: 27 s (ref 24–33)

## 2024-10-28 LAB — CBC WITH DIFFERENTIAL/PLATELET
Basophils Absolute: 0.1 10*3/uL (ref 0.0–0.2)
Basos: 1 %
EOS (ABSOLUTE): 0.4 10*3/uL (ref 0.0–0.4)
Eos: 6 %
Hematocrit: 42.9 % (ref 34.0–46.6)
Hemoglobin: 13.9 g/dL (ref 11.1–15.9)
Immature Grans (Abs): 0 10*3/uL (ref 0.0–0.1)
Immature Granulocytes: 0 %
Lymphocytes Absolute: 1.5 10*3/uL (ref 0.7–3.1)
Lymphs: 25 %
MCH: 31.5 pg (ref 26.6–33.0)
MCHC: 32.4 g/dL (ref 31.5–35.7)
MCV: 97 fL (ref 79–97)
Monocytes Absolute: 0.4 10*3/uL (ref 0.1–0.9)
Monocytes: 6 %
Neutrophils Absolute: 3.7 10*3/uL (ref 1.4–7.0)
Neutrophils: 62 %
Platelets: 353 10*3/uL (ref 150–450)
RBC: 4.41 x10E6/uL (ref 3.77–5.28)
RDW: 12.2 % (ref 11.7–15.4)
WBC: 6 10*3/uL (ref 3.4–10.8)

## 2024-10-28 LAB — VON WILLEBRAND PANEL
Factor VIII Activity: 50 — AB (ref 56–140)
Von Willebrand Ag: 76 (ref 50–200)
Von Willebrand Factor: 72 (ref 50–200)

## 2024-10-28 LAB — THYROID PANEL WITH TSH
Free Thyroxine Index: 1.7 (ref 1.2–4.9)
T3 Uptake Ratio: 24 % (ref 24–39)
T4, Total: 7.2 ug/dL (ref 4.5–12.0)
TSH: 2.38 u[IU]/mL (ref 0.450–4.500)

## 2024-10-28 LAB — ESTRADIOL: Estradiol: 37.9 pg/mL

## 2024-10-28 LAB — COAG STUDIES INTERP REPORT

## 2024-10-28 LAB — FSH/LH
FSH: 6.8 m[IU]/mL
LH: 5.4 m[IU]/mL

## 2024-11-08 ENCOUNTER — Ambulatory Visit

## 2024-12-06 ENCOUNTER — Encounter: Payer: Self-pay | Admitting: Physician Assistant
